# Patient Record
Sex: Female | Born: 1990 | Race: White | Hispanic: No | Smoking: Never smoker
Health system: Southern US, Community
[De-identification: ages and names within clinical notes are randomized; demographics above are authoritative.]

## PROBLEM LIST (undated history)

## (undated) ENCOUNTER — Inpatient Hospital Stay (HOSPITAL_COMMUNITY): Payer: Self-pay

## (undated) DIAGNOSIS — F32A Depression, unspecified: Secondary | ICD-10-CM

## (undated) DIAGNOSIS — F419 Anxiety disorder, unspecified: Secondary | ICD-10-CM

## (undated) DIAGNOSIS — F329 Major depressive disorder, single episode, unspecified: Secondary | ICD-10-CM

## (undated) DIAGNOSIS — T7840XA Allergy, unspecified, initial encounter: Secondary | ICD-10-CM

## (undated) DIAGNOSIS — K219 Gastro-esophageal reflux disease without esophagitis: Secondary | ICD-10-CM

## (undated) DIAGNOSIS — F509 Eating disorder, unspecified: Secondary | ICD-10-CM

## (undated) DIAGNOSIS — F319 Bipolar disorder, unspecified: Secondary | ICD-10-CM

## (undated) DIAGNOSIS — T50901A Poisoning by unspecified drugs, medicaments and biological substances, accidental (unintentional), initial encounter: Secondary | ICD-10-CM

## (undated) DIAGNOSIS — G43009 Migraine without aura, not intractable, without status migrainosus: Secondary | ICD-10-CM

## (undated) DIAGNOSIS — K829 Disease of gallbladder, unspecified: Secondary | ICD-10-CM

## (undated) DIAGNOSIS — Z8619 Personal history of other infectious and parasitic diseases: Secondary | ICD-10-CM

## (undated) HISTORY — DX: Migraine without aura, not intractable, without status migrainosus: G43.009

## (undated) HISTORY — DX: Gastro-esophageal reflux disease without esophagitis: K21.9

## (undated) HISTORY — DX: Bipolar disorder, unspecified: F31.9

## (undated) HISTORY — DX: Personal history of other infectious and parasitic diseases: Z86.19

## (undated) HISTORY — DX: Disease of gallbladder, unspecified: K82.9

## (undated) HISTORY — DX: Eating disorder, unspecified: F50.9

## (undated) HISTORY — DX: Allergy, unspecified, initial encounter: T78.40XA

## (undated) HISTORY — DX: Anxiety disorder, unspecified: F41.9

---
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
## 2011-01-19 ENCOUNTER — Emergency Department: Payer: Self-pay | Admitting: Emergency Medicine

## 2011-10-03 ENCOUNTER — Inpatient Hospital Stay (HOSPITAL_COMMUNITY)
Admission: EM | Admit: 2011-10-03 | Discharge: 2011-10-05 | DRG: 918 | Disposition: A | Discharge status: Home / Self Care | Payer: Self-pay | Attending: Internal Medicine | Admitting: Internal Medicine

## 2011-10-03 ENCOUNTER — Encounter (HOSPITAL_COMMUNITY): Payer: Self-pay | Admitting: *Deleted

## 2011-10-03 DIAGNOSIS — T43502A Poisoning by unspecified antipsychotics and neuroleptics, intentional self-harm, initial encounter: Secondary | ICD-10-CM | POA: Diagnosis present

## 2011-10-03 DIAGNOSIS — T50901A Poisoning by unspecified drugs, medicaments and biological substances, accidental (unintentional), initial encounter: Secondary | ICD-10-CM | POA: Diagnosis present

## 2011-10-03 DIAGNOSIS — F3289 Other specified depressive episodes: Secondary | ICD-10-CM | POA: Diagnosis present

## 2011-10-03 DIAGNOSIS — F32A Depression, unspecified: Secondary | ICD-10-CM

## 2011-10-03 DIAGNOSIS — T1491XA Suicide attempt, initial encounter: Secondary | ICD-10-CM | POA: Diagnosis present

## 2011-10-03 DIAGNOSIS — T43294A Poisoning by other antidepressants, undetermined, initial encounter: Principal | ICD-10-CM | POA: Diagnosis present

## 2011-10-03 DIAGNOSIS — F329 Major depressive disorder, single episode, unspecified: Secondary | ICD-10-CM | POA: Diagnosis present

## 2011-10-03 HISTORY — DX: Major depressive disorder, single episode, unspecified: F32.9

## 2011-10-03 HISTORY — DX: Depression, unspecified: F32.A

## 2011-10-03 HISTORY — DX: Poisoning by unspecified drugs, medicaments and biological substances, accidental (unintentional), initial encounter: T50.901A

## 2011-10-03 LAB — CBC
Hemoglobin: 13.3 g/dL (ref 12.0–15.0)
MCH: 29.9 pg (ref 26.0–34.0)
MCHC: 35.7 g/dL (ref 30.0–36.0)
MCV: 83.8 fL (ref 78.0–100.0)
RBC: 4.45 MIL/uL (ref 3.87–5.11)

## 2011-10-03 LAB — COMPREHENSIVE METABOLIC PANEL
ALT: 9 U/L (ref 0–35)
CO2: 23 mEq/L (ref 19–32)
Calcium: 9.2 mg/dL (ref 8.4–10.5)
Creatinine, Ser: 0.77 mg/dL (ref 0.50–1.10)
GFR calc Af Amer: >90 mL/min (ref >90)
GFR calc non Af Amer: >90 mL/min (ref >90)
Glucose, Bld: 140 mg/dL — ABNORMAL HIGH (ref 70–99)
Sodium: 137 mEq/L (ref 135–145)
Total Protein: 7.4 g/dL (ref 6.0–8.3)

## 2011-10-03 LAB — ACETAMINOPHEN LEVEL: Acetaminophen (Tylenol), Serum: <15 ug/mL (ref 10–30)

## 2011-10-03 LAB — RAPID URINE DRUG SCREEN, HOSP PERFORMED
Cocaine: NOT DETECTED
Opiates: NOT DETECTED

## 2011-10-03 LAB — POCT PREGNANCY, URINE: Preg Test, Ur: NEGATIVE

## 2011-10-03 LAB — ETHANOL: Alcohol, Ethyl (B): <11 mg/dL (ref 0–11)

## 2011-10-03 NOTE — ED Notes (Signed)
Pt c/o trazodone OD, unknown amount, taken 45 mins ago. Pt states she is depressed but does not want to die. Pt having dry heaves in triage, pale, oriented x 3

## 2011-10-03 NOTE — ED Notes (Signed)
Poison control called.  Trazadone causes CNS depression, hypotension, and respiratory depression as well as QT prolongation and bradycardia.  Celexa causes nausea and vomiting, mild sedation, possibly seizures, QTc prolongation, and QRS prolongation. For ingestion 600mg  or more of Celexa, monitor x 24 hours for possible delayed effect.  Charcoal is considered optional at this point.

## 2011-10-03 NOTE — ED Provider Notes (Signed)
History     CSN: [id number]  Arrival date & time 10/03/11  2221   First MD Initiated Contact with Patient 10/03/11 2303      Chief Complaint  Patient presents with  . Drug Overdose    trazodone  . Medical Clearance  . Suicidal    (Consider location/radiation/quality/duration/timing/severity/associated sxs/prior treatment) HPI Comments: Patient amount of trazodone and Celexa about one hour prior to arrival. She states she has a history of depression but had a fight with her mom it made her very sad and she wanted to kill herself. Last suicidal attempt was 3 years ago. Patient's only complaint currently is nausea.  Patient is a 21 y.o. female presenting with Overdose. The history is provided by the patient and a parent.  Drug Overdose This is a new problem. The current episode started 1 to 2 hours ago. The problem occurs constantly. The problem has not changed since onset.Pertinent negatives include no abdominal pain. Associated symptoms comments: nausea. The symptoms are aggravated by nothing. The symptoms are relieved by nothing. She has tried nothing for the symptoms. The treatment provided no relief.    Past Medical History  Diagnosis Date  . Overdose drug   . Depression     History reviewed. No pertinent past surgical history.  No family history on file.  History  Substance Use Topics  . Smoking status: Not on file  . Smokeless tobacco: Not on file  . Alcohol Use: Yes    OB History    Grav Para Term Preterm Abortions TAB SAB Ect Mult Living                  Review of Systems  Gastrointestinal: Positive for nausea. Negative for vomiting, abdominal pain and diarrhea.  Psychiatric/Behavioral: Positive for suicidal ideas.  All other systems reviewed and are negative.    Allergies  Review of patient's allergies indicates no known allergies.  Home Medications  No current outpatient prescriptions on file.  BP 106/92  Pulse 66  Temp(Src) 98.9 F (37.2 C)  (Oral)  Resp 19  SpO2 100%  LMP 10/03/2011  Physical Exam  Nursing note and vitals reviewed. Constitutional: She is oriented to person, place, and time. She appears well-developed and well-nourished. No distress.  HENT:  Head: Normocephalic and atraumatic.  Mouth/Throat: Oropharynx is clear and moist.  Eyes: EOM are normal. Pupils are equal, round, and reactive to light.  Cardiovascular: Normal rate, regular rhythm, normal heart sounds and intact distal pulses.  Exam reveals no friction rub.   No murmur heard. Pulmonary/Chest: Effort normal and breath sounds normal. She has no wheezes. She has no rales.  Abdominal: Soft. Bowel sounds are normal. She exhibits no distension. There is no tenderness. There is no rebound and no guarding.  Musculoskeletal: Normal range of motion. She exhibits no tenderness.       No edema  Neurological: She is alert and oriented to person, place, and time. No cranial nerve deficit.  Skin: Skin is warm and dry. No rash noted.  Psychiatric: Her behavior is normal. She exhibits a depressed mood. She expresses suicidal ideation. She expresses suicidal plans.    ED Course  Procedures (including critical care time)  Labs Reviewed  COMPREHENSIVE METABOLIC PANEL - Abnormal; Notable for the following:    Glucose, Bld 140 (*)    Total Bilirubin 1.4 (*)    All other components within normal limits  SALICYLATE LEVEL - Abnormal; Notable for the following:    Salicylate Lvl <2.0 (*)  All other components within normal limits  CBC  ETHANOL  ACETAMINOPHEN LEVEL  URINE RAPID DRUG SCREEN (HOSP PERFORMED)  TRICYCLICS SCREEN, URINE  POCT PREGNANCY, URINE  CBC  CREATININE, SERUM  BASIC METABOLIC PANEL  CBC   No results found.   Date: 10/04/2011  Rate: 72  Rhythm: normal sinus rhythm  QRS Axis: normal  Intervals: normal  ST/T Wave abnormalities: normal  Conduction Disutrbances:none  Narrative Interpretation:   Old EKG Reviewed: none available    1.  Drug overdose       MDM   Patient states she was trying to overdose today by taking an and then amount of trazodone and Celexa. Her only complaint currently is nausea. EKG did not show any prolonged QT at this time. Poison control was contacted and they suggested checking a CMP, alcohol, acetaminophen, salicylate, drug screen. They recommended hospitalization due to the unknown amount of Celexa ingested as it could cause QT prolongation.        Gwyneth Sprout, MD 10/04/11 [phone]

## 2011-10-03 NOTE — ED Notes (Signed)
Circa 2145 tonight, the pt took an unknown amount of trazadone 50mg  and an unknown amount of 20mg  celexa.  Pt states she is depressed and was trying to kill herself.  This is not the first time the pt has attempted suicide.

## 2011-10-04 ENCOUNTER — Other Ambulatory Visit: Payer: Self-pay

## 2011-10-04 ENCOUNTER — Encounter (HOSPITAL_COMMUNITY): Payer: Self-pay | Admitting: Family Medicine

## 2011-10-04 DIAGNOSIS — T50901A Poisoning by unspecified drugs, medicaments and biological substances, accidental (unintentional), initial encounter: Secondary | ICD-10-CM | POA: Diagnosis present

## 2011-10-04 DIAGNOSIS — T1491XA Suicide attempt, initial encounter: Secondary | ICD-10-CM | POA: Diagnosis present

## 2011-10-04 DIAGNOSIS — F32A Depression, unspecified: Secondary | ICD-10-CM

## 2011-10-04 DIAGNOSIS — F329 Major depressive disorder, single episode, unspecified: Secondary | ICD-10-CM

## 2011-10-04 LAB — TRICYCLICS SCREEN, URINE: TCA Scrn: NOT DETECTED

## 2011-10-04 MED ORDER — POTASSIUM CHLORIDE IN NACL 20-0.9 MEQ/L-% IV SOLN
INTRAVENOUS | Status: DC
  Administered 2011-10-04: 75 mL/h via INTRAVENOUS
  Administered 2011-10-04: 20:00:00 via INTRAVENOUS
  Filled 2011-10-04 (×5): qty 1000

## 2011-10-04 MED ORDER — SODIUM CHLORIDE 0.9 % IV BOLUS (SEPSIS)
1000.0000 mL | Freq: Once | INTRAVENOUS | Status: AC
  Administered 2011-10-04: 1000 mL via INTRAVENOUS

## 2011-10-04 MED ORDER — ENOXAPARIN SODIUM 40 MG/0.4ML ~~LOC~~ SOLN
40.0000 mg | SUBCUTANEOUS | Status: DC
  Filled 2011-10-04: qty 0.4

## 2011-10-04 NOTE — H&P (Signed)
PCP:   None Psychiatrist is Dr. Hope Pigeon   Chief Complaint:  Overdose  HPI: This is a 21 year old female with history of depression. She is prescribed trazodone 50 mg carefully when necessary insomnia and Celexa 20 mg daily. Per patient and mother, she has lots of stressors, including her job and money issues. She had an appointment with her counselor last week, which she missed and today she " snapped". At approximately 9:30 PM she took a handful of Celexa and trazodone, we are unable to get any quantification as to how much she took. This was eventually discovered and she was brought to the ER. In the ER the patient's EKG has no prolonged QTc interval. On discussion with poison control they recommend she should monitor on telemetry for 24 hours, with serial EKGs Hasson Celexa can cause late onset QTC prolongation. Patient had some nausea and vomiting earlier but no other symptomatology since. History provided by the patient and her multiple bedside.  Review of Systems: Positives bolded   The patient denies anorexia, fever, weight loss,, vision loss, decreased hearing, hoarseness, chest pain, syncope, dyspnea on exertion, peripheral edema, balance deficits, hemoptysis, abdominal pain, melena, hematochezia, severe indigestion/heartburn, hematuria, incontinence, genital sores, muscle weakness, suspicious skin lesions, transient blindness, difficulty walking, depression, unusual weight change, abnormal bleeding, enlarged lymph nodes, angioedema, and breast masses.  Past Medical History: Past Medical History  Diagnosis Date  . Overdose drug   . Depression    History reviewed. No pertinent past surgical history.  Medications: Prior to Admission medications   Medication Sig Start Date End Date Taking? Authorizing Provider  citalopram (CELEXA) 20 MG tablet Take 20 mg by mouth daily.   Yes Historical Provider, MD  traZODone (DESYREL) 50 MG tablet Take 50 mg by mouth at bedtime as  needed. For sleep   Yes Historical Provider, MD    Allergies:  No Known Allergies  Social History:  reports that she has never smoked. She does not have any smokeless tobacco history on file. She reports that she drinks alcohol. She reports that she does not use illicit drugs.  Family History: Family History  Problem Relation Age of Onset  . Coronary artery disease    . Hypothyroidism      Physical Exam: Filed Vitals:   10/03/11 2228 10/03/11 2234 10/04/11 0023 10/04/11 0107  BP: 96/62 106/92 88/51 95/55   Pulse: 66 66 79 80  Temp: 98 F (36.7 C) 98.9 F (37.2 C) 98.3 F (36.8 C) 98.3 F (36.8 C)  TempSrc: Oral Oral Oral Oral  Resp: 16 19 18 18   SpO2: 98% 100% 100% 99%    General:  Alert and oriented times three, well developed and nourished, no acute distress Eyes: PERRLA, pink conjunctiva, no scleral icterus ENT: Dry oral mucosa, neck supple, no thyromegaly Lungs: clear to ascultation, no wheeze, no crackles, no use of accessory muscles Cardiovascular: regular rate and rhythm, no regurgitation, no gallops, no murmurs. No carotid bruits, no JVD Abdomen: soft, positive BS, non-tender, non-distended, no organomegaly, not an acute abdomen GU: not examined Neuro: CN II - XII grossly intact, sensation intact Musculoskeletal: strength 5/5 all extremities, no clubbing, cyanosis or edema Skin: no rash, no subcutaneous crepitation, no decubitus Psych: appropriate patient   Labs on Admission:   [facility] 10/03/11 2259  NA 137  K 3.6  CL 102  CO2 23  GLUCOSE 140*  BUN 9  CREATININE 0.77  CALCIUM 9.2  MG --  PHOS --    Basename  10/03/11 2259  AST 11  ALT 9  ALKPHOS 74  BILITOT 1.4*  PROT 7.4  ALBUMIN 4.5   No results found for this basename: LIPASE:2,AMYLASE:2 in the last 72 hours  Basename 10/03/11 2259  WBC 5.9  NEUTROABS --  HGB 13.3  HCT 37.3  MCV 83.8  PLT 209   No results found for this basename: CKTOTAL:3,CKMB:3,CKMBINDEX:3,TROPONINI:3 in the  last 72 hours No components found with this basename: POCBNP:3 No results found for this basename: DDIMER:2 in the last 72 hours No results found for this basename: HGBA1C:2 in the last 72 hours No results found for this basename: CHOL:2,HDL:2,LDLCALC:2,TRIG:2,CHOLHDL:2,LDLDIRECT:2 in the last 72 hours No results found for this basename: TSH,T4TOTAL,FREET3,T3FREE,THYROIDAB in the last 72 hours No results found for this basename: VITAMINB12:2,FOLATE:2,FERRITIN:2,TIBC:2,IRON:2,RETICCTPCT:2 in the last 72 hours  Micro Results: No results found for this or any previous visit (from the past 240 hour(s)).   Radiological Exams on Admission: No results found.  Assessment/Plan Present on Admission:  .Overdose Suicide attempt Admit to telemetry measure with a one-to-one setting Monitor on telemetry with EKGs Q4 hours to evaluate for QTc prolongation. Poison control recommendation. Consult psychiatry in a.m. Home medications held  Full code DVT prophylaxis  team a/Dr. Thalia Bloodgood, Fadil Macmaster 10/04/2011, 2:07 AM

## 2011-10-04 NOTE — ED Notes (Signed)
Report given to K. Jeraldine Loots, RN

## 2011-10-04 NOTE — Progress Notes (Signed)
H&P/chart reviewed ,patient was admitted for suicidal attempt and drug overdose . Patient seen and examined ,sleepy but easily arousable ,denied any current complaints,admitted that she took celexa and trazodone to harm herself. EKGs reviewed showed normal findings so far x 3 EKGS ,as per H&P,poison control recommended EKG q4h ,continue to monitor on telemetry for 24 hours.continue 1:1 sitter.Psych service was consulted

## 2011-10-04 NOTE — Consult Note (Signed)
Reason for Consult: [name] is an 21 y.o. female.  HPI: Per H&P, This is a 21 year old female with history of depression. She is prescribed trazodone 50 mg carefully when necessary insomnia and Celexa 20 mg daily. Per patient and mother, she has lots of stressors, including her job and money issues. She had an appointment with her counselor last week, which she missed and today she " snapped". At approximately 9:30 PM she took a handful of Celexa and trazodone, we are unable to get any quantification as to how much she took. This was eventually discovered and she was brought to the ER. In the ER the patient's EKG has no prolonged QTc interval. On discussion with poison control they recommend she should monitor on telemetry for 24 hours, with serial EKGs Hasson Celexa can cause late onset QTC prolongation.   Per pt she has hx of depression since 04/2011. celexa was not helpful and used it for about 3 months. Follows with a psychiatrist currently. Sleep is very irregular due to her work schedule. Takes trazodone with some help. Had an argument with her mother before this OD and it made her upset and she took the OD (unknown quantity) impulsively in front her mother. She told her mother later that she should be brought to ED for help as she was not feeling well. One OD in 3 years ago and was admitted at [facility] and she did not like the hospital. Reports questionable hypomanic episodes at times but no manic symptoms currently. wants to go home and work on sleep hyegiene and work schedule. Plan to see her therapist and psychiatrist soon. Mother is also not concerned about this OD as SI and wants to take her home after medical clearance. No SI, NO hi or or AVH now.   Past Medical History  Diagnosis Date  . Overdose drug   . Depression     History reviewed. No pertinent past surgical history.  Family History  Problem Relation Age of Onset  . Coronary artery disease    . Hypothyroidism       Social History:  reports that she has never smoked. She does not have any smokeless tobacco history on file. She reports that she drinks about 1.2 ounces of alcohol per week. She reports that she does not use illicit drugs.  Allergies: No Known Allergies  Medications: I have reviewed the patient's current medications.  Results for orders placed during the hospital encounter of 10/03/11 (from the past 48 hour(s))  CBC     Status: Normal   Collection Time   10/03/11 10:59 PM      Component Value Range Comment   WBC 5.9  4.0 - 10.5 (K/uL)    RBC 4.45  3.87 - 5.11 (MIL/uL)    Hemoglobin 13.3  12.0 - 15.0 (g/dL)    HCT 16.1  09.6 - 04.5 (%)    MCV 83.8  78.0 - 100.0 (fL)    MCH 29.9  26.0 - 34.0 (pg)    MCHC 35.7  30.0 - 36.0 (g/dL)    RDW 40.9  81.1 - 91.4 (%)    Platelets 209  150 - 400 (K/uL)   COMPREHENSIVE METABOLIC PANEL     Status: Abnormal   Collection Time   10/03/11 10:59 PM      Component Value Range Comment   Sodium 137  135 - 145 (mEq/L)    Potassium 3.6  3.5 - 5.1 (mEq/L)    Chloride 102  96 - 112 (mEq/L)    CO2 23  19 - 32 (mEq/L)    Glucose, Bld 140 (*) 70 - 99 (mg/dL)    BUN 9  6 - 23 (mg/dL)    Creatinine, Ser 1.61  0.50 - 1.10 (mg/dL)    Calcium 9.2  8.4 - 10.5 (mg/dL)    Total Protein 7.4  6.0 - 8.3 (g/dL)    Albumin 4.5  3.5 - 5.2 (g/dL)    AST 11  0 - 37 (U/L)    ALT 9  0 - 35 (U/L)    Alkaline Phosphatase 74  39 - 117 (U/L)    Total Bilirubin 1.4 (*) 0.3 - 1.2 (mg/dL)    GFR calc non Af Amer >90  >90 (mL/min)    GFR calc Af Amer >90  >90 (mL/min)   ETHANOL     Status: Normal   Collection Time   10/03/11 10:59 PM      Component Value Range Comment   Alcohol, Ethyl (B) <11  0 - 11 (mg/dL)   ACETAMINOPHEN LEVEL     Status: Normal   Collection Time   10/03/11 10:59 PM      Component Value Range Comment   Acetaminophen (Tylenol), Serum <15.0  10 - 30 (ug/mL)   SALICYLATE LEVEL     Status: Abnormal   Collection Time   10/03/11 10:59 PM      Component  Value Range Comment   Salicylate Lvl <2.0 (*) 2.8 - 20.0 (mg/dL)   URINE RAPID DRUG SCREEN (HOSP PERFORMED)     Status: Normal   Collection Time   10/03/11 11:26 PM      Component Value Range Comment   Opiates NONE DETECTED  NONE DETECTED     Cocaine NONE DETECTED  NONE DETECTED     Benzodiazepines NONE DETECTED  NONE DETECTED     Amphetamines NONE DETECTED  NONE DETECTED     Tetrahydrocannabinol NONE DETECTED  NONE DETECTED     Barbiturates NONE DETECTED  NONE DETECTED    TRICYCLICS SCREEN, URINE     Status: Normal   Collection Time   10/03/11 11:26 PM      Component Value Range Comment   TCA Scrn NONE DETECTED  NONE DETECTED    POCT PREGNANCY, URINE     Status: Normal   Collection Time   10/03/11 11:31 PM      Component Value Range Comment   Preg Test, Ur NEGATIVE  NEGATIVE      No results found.  ROS Blood pressure 94/60, pulse 72, temperature 98.9 F (37.2 C), temperature source Oral, resp. rate 18, height 5\' 5"  (1.651 m), weight 57.924 kg (127 lb 11.2 oz), last menstrual period 10/03/2011, SpO2 98.00%. Physical Exam  Good eye contact, happy mood, affect broad, no si, no hi, no avh, logical, fair insight  Assessment/ 1.adjustement d/o with mixed emotions, mood d/o nod III. Def II. Sp od IV. Financial problems V; 45    Plan:   1. Offered inpatient psy but both pt and mother refused  2. NO SI thoughts and will recommend out pt psy f/u after discharge  3. Will sign off now.  Wonda Cerise 10/04/2011, 11:11 PM

## 2011-10-05 LAB — BASIC METABOLIC PANEL
CO2: 24 mEq/L (ref 19–32)
Chloride: 104 mEq/L (ref 96–112)
Creatinine, Ser: 0.65 mg/dL (ref 0.50–1.10)
GFR calc Af Amer: >90 mL/min (ref >90)
Potassium: 3.7 mEq/L (ref 3.5–5.1)
Sodium: 134 mEq/L — ABNORMAL LOW (ref 135–145)

## 2011-10-05 LAB — CBC
MCH: 29.6 pg (ref 26.0–34.0)
MCHC: 34.7 g/dL (ref 30.0–36.0)
Platelets: 197 10*3/uL (ref 150–400)

## 2011-10-05 NOTE — Discharge Summary (Signed)
Admit date: 10/03/2011 Discharge date: 10/05/2011  Primary Care Physician:  No primary provider on file.   Discharge Diagnoses:   Active Hospital Problems  Diagnoses Date Noted   . Depression 10/04/2011     Resolved Hospital Problems  Diagnoses Date Noted Date Resolved  . Overdose 10/04/2011 10/05/2011  . Suicide attempt 10/04/2011 10/05/2011     DISCHARGE MEDICATION: Medication List  As of 10/05/2011  9:22 AM   TAKE these medications         citalopram 20 MG tablet   Commonly known as: CELEXA   Take 20 mg by mouth daily.      traZODone 50 MG tablet   Commonly known as: DESYREL   Take 50 mg by mouth at bedtime as needed. For sleep              Consults:     SIGNIFICANT DIAGNOSTIC STUDIES:  No results found.   No results found for this or any previous visit (from the past 240 hour(s)).  BRIEF ADMITTING H & P: 21 year old female with history of depression. She is prescribed trazodone 50 mg carefully when necessary insomnia and Celexa 20 mg daily. Per patient and mother, she has lots of stressors, including her job and money issues. She had an appointment with her counselor last week, which she missed and today she " snapped". At approximately 9:30 PM she took a handful of Celexa and trazodone, we are unable to get any quantification as to how much she took. This was eventually discovered and she was brought to the ER. In the ER the patient's EKG has no prolonged QTc interval. On discussion with poison control they recommend she should monitor on telemetry for 24 hours, with serial EKGs Hasson Celexa can cause late onset QTC prolongation. Patient had some nausea and vomiting earlier but no other symptomatology since. History provided by the patient and her multiple bedside.   Active Hospital Problems  Diagnoses Date Noted   . Depression: Psychiatry was consulted and recommended to followup as an outpatient continue current medications.  10/04/2011     Resolved Hospital  Problems  Diagnoses Date Noted Date Resolved  . Overdose: She was admitted to the step down, poison control was consulted they recommended to follow her QTC which has been within normal limits. Psychiatry was consulted she refuses inpatient psych psych recommended to followup with her psychiatry as an outpatient. Her mother was at bedside and she agreed in the conversation that she can followup with her psychiatrist and outpatient.  10/04/2011 10/05/2011  . Suicide attempt: Psychiatry was consulted she was cleared, for followup with her psychiatry as an outpatient.  10/04/2011 10/05/2011     Disposition and Follow-up:  Discharge Orders    Future Orders Please Complete By Expires   Diet - low sodium heart healthy      Increase activity slowly           DISCHARGE EXAM:  General: Alert and oriented times three, well developed and nourished, no acute distress  Eyes: PERRLA, pink conjunctiva, no scleral icterus  ENT: moist mucose memebrane, neck supple, no thyromegaly  Lungs: clear to ascultation, no wheeze, no crackles, no use of accessory muscles  Cardiovascular: regular rate and rhythm, no regurgitation, no gallops, no murmurs. No carotid bruits, no JVD  Abdomen: soft, positive BS, non-tender, non-distended, no organomegaly, not an acute abdomen  GU: not examined  Neuro: CN II - XII grossly intact, sensation intact  Musculoskeletal: strength 5/5 all extremities, no clubbing, cyanosis  or edema  Skin: no rash, no subcutaneous crepitation, no decubitus  Psych: appropriate patient   Blood pressure 100/60, pulse 62, temperature 98.2 F (36.8 C), temperature source Oral, resp. rate 18, height 5\' 5"  (1.651 m), weight 57.924 kg (127 lb 11.2 oz), last menstrual period 10/03/2011, SpO2 100.00%.   Basename 10/05/11 0450 10/03/11 2259  NA 134* 137  K 3.7 3.6  CL 104 102  CO2 24 23  GLUCOSE 90 140*  BUN 4* 9  CREATININE 0.65 0.77  CALCIUM 8.8 9.2  MG -- --  PHOS -- --    Basename  10/03/11 2259  AST 11  ALT 9  ALKPHOS 74  BILITOT 1.4*  PROT 7.4  ALBUMIN 4.5   No results found for this basename: LIPASE:2,AMYLASE:2 in the last 72 hours  Basename 10/05/11 0450 10/03/11 2259  WBC 6.5 5.9  NEUTROABS -- --  HGB 11.7* 13.3  HCT 33.7* 37.3  MCV 85.3 83.8  PLT 197 209    Signed: Marinda Elk M.D. 10/05/2011, 9:22 AM

## 2012-09-17 ENCOUNTER — Emergency Department: Payer: Self-pay | Admitting: Emergency Medicine

## 2012-09-17 LAB — URINALYSIS, COMPLETE
Bilirubin,UR: NEGATIVE
Ketone: NEGATIVE
Leukocyte Esterase: NEGATIVE
Ph: 5 (ref 4.5–8.0)
Protein: 30
RBC,UR: 5 /HPF (ref 0–5)

## 2012-09-17 LAB — COMPREHENSIVE METABOLIC PANEL
Anion Gap: 6 — ABNORMAL LOW (ref 7–16)
BUN: 13 mg/dL (ref 7–18)
Chloride: 104 mmol/L (ref 98–107)
Creatinine: 0.76 mg/dL (ref 0.60–1.30)
EGFR (African American): >60
EGFR (Non-African Amer.): >60
Glucose: 98 mg/dL (ref 65–99)
Osmolality: 276 (ref 275–301)
SGOT(AST): 17 U/L (ref 15–37)
Total Protein: 7.5 g/dL (ref 6.4–8.2)

## 2012-09-17 LAB — CBC
HCT: 39 % (ref 35.0–47.0)
HGB: 13.5 g/dL (ref 12.0–16.0)
MCH: 29.6 pg (ref 26.0–34.0)
MCHC: 34.7 g/dL (ref 32.0–36.0)
Platelet: 231 10*3/uL (ref 150–440)
RBC: 4.57 10*6/uL (ref 3.80–5.20)
RDW: 12.5 % (ref 11.5–14.5)
WBC: 3.9 10*3/uL (ref 3.6–11.0)

## 2012-09-17 LAB — BILIRUBIN, DIRECT: Bilirubin, Direct: 0.2 mg/dL (ref 0.00–0.20)

## 2012-09-17 LAB — LIPASE, BLOOD: Lipase: 68 U/L — ABNORMAL LOW (ref 73–393)

## 2015-12-23 DIAGNOSIS — G44219 Episodic tension-type headache, not intractable: Secondary | ICD-10-CM | POA: Diagnosis not present

## 2016-03-01 DIAGNOSIS — J069 Acute upper respiratory infection, unspecified: Secondary | ICD-10-CM | POA: Diagnosis not present

## 2016-04-08 ENCOUNTER — Other Ambulatory Visit: Payer: Self-pay | Admitting: *Deleted

## 2016-04-08 ENCOUNTER — Ambulatory Visit: Payer: Self-pay | Admitting: Nurse Practitioner

## 2016-04-08 ENCOUNTER — Other Ambulatory Visit (INDEPENDENT_AMBULATORY_CARE_PROVIDER_SITE_OTHER): Payer: BLUE CROSS/BLUE SHIELD

## 2016-04-08 ENCOUNTER — Ambulatory Visit (INDEPENDENT_AMBULATORY_CARE_PROVIDER_SITE_OTHER): Payer: BLUE CROSS/BLUE SHIELD | Admitting: Nurse Practitioner

## 2016-04-08 ENCOUNTER — Other Ambulatory Visit (HOSPITAL_COMMUNITY)
Admission: RE | Admit: 2016-04-08 | Discharge: 2016-04-08 | Disposition: A | Discharge status: Home / Self Care | Payer: BLUE CROSS/BLUE SHIELD | Source: Ambulatory Visit | Attending: Nurse Practitioner | Admitting: Nurse Practitioner

## 2016-04-08 ENCOUNTER — Encounter: Payer: Self-pay | Admitting: Nurse Practitioner

## 2016-04-08 ENCOUNTER — Other Ambulatory Visit: Payer: BLUE CROSS/BLUE SHIELD

## 2016-04-08 VITALS — BP 102/69 | Temp 98.1°F | Ht 64.0 in | Wt 149.0 lb

## 2016-04-08 DIAGNOSIS — K219 Gastro-esophageal reflux disease without esophagitis: Secondary | ICD-10-CM | POA: Diagnosis not present

## 2016-04-08 DIAGNOSIS — Z113 Encounter for screening for infections with a predominantly sexual mode of transmission: Secondary | ICD-10-CM | POA: Insufficient documentation

## 2016-04-08 DIAGNOSIS — Z1151 Encounter for screening for human papillomavirus (HPV): Secondary | ICD-10-CM | POA: Insufficient documentation

## 2016-04-08 DIAGNOSIS — G4700 Insomnia, unspecified: Secondary | ICD-10-CM | POA: Insufficient documentation

## 2016-04-08 DIAGNOSIS — F319 Bipolar disorder, unspecified: Secondary | ICD-10-CM | POA: Insufficient documentation

## 2016-04-08 DIAGNOSIS — R6889 Other general symptoms and signs: Secondary | ICD-10-CM | POA: Diagnosis not present

## 2016-04-08 DIAGNOSIS — Z0001 Encounter for general adult medical examination with abnormal findings: Secondary | ICD-10-CM

## 2016-04-08 DIAGNOSIS — Z23 Encounter for immunization: Secondary | ICD-10-CM

## 2016-04-08 DIAGNOSIS — R1013 Epigastric pain: Secondary | ICD-10-CM | POA: Diagnosis not present

## 2016-04-08 DIAGNOSIS — Z62819 Personal history of unspecified abuse in childhood: Secondary | ICD-10-CM | POA: Diagnosis not present

## 2016-04-08 DIAGNOSIS — Z01411 Encounter for gynecological examination (general) (routine) with abnormal findings: Secondary | ICD-10-CM | POA: Diagnosis not present

## 2016-04-08 DIAGNOSIS — F329 Major depressive disorder, single episode, unspecified: Secondary | ICD-10-CM | POA: Diagnosis not present

## 2016-04-08 DIAGNOSIS — F32A Depression, unspecified: Secondary | ICD-10-CM

## 2016-04-08 DIAGNOSIS — R87610 Atypical squamous cells of undetermined significance on cytologic smear of cervix (ASC-US): Secondary | ICD-10-CM | POA: Diagnosis not present

## 2016-04-08 LAB — CBC WITH DIFFERENTIAL/PLATELET
BASOS PCT: 0.5 % (ref 0.0–3.0)
Basophils Absolute: 0 10*3/uL (ref 0.0–0.1)
EOS PCT: 1.2 % (ref 0.0–5.0)
Eosinophils Absolute: 0.1 10*3/uL (ref 0.0–0.7)
HCT: 37.8 % (ref 36.0–46.0)
Hemoglobin: 13.4 g/dL (ref 12.0–15.0)
LYMPHS ABS: 3.1 10*3/uL (ref 0.7–4.0)
Lymphocytes Relative: 40.5 % (ref 12.0–46.0)
MCHC: 35.3 g/dL (ref 30.0–36.0)
MCV: 84.5 fl (ref 78.0–100.0)
MONO ABS: 0.4 10*3/uL (ref 0.1–1.0)
MONOS PCT: 5.1 % (ref 3.0–12.0)
NEUTROS ABS: 4 10*3/uL (ref 1.4–7.7)
NEUTROS PCT: 52.7 % (ref 43.0–77.0)
Platelets: 304 10*3/uL (ref 150.0–400.0)
RBC: 4.47 Mil/uL (ref 3.87–5.11)
RDW: 12.8 % (ref 11.5–15.5)
WBC: 7.6 10*3/uL (ref 4.0–10.5)

## 2016-04-08 LAB — COMPREHENSIVE METABOLIC PANEL
ALBUMIN: 4.2 g/dL (ref 3.5–5.2)
ALK PHOS: 50 U/L (ref 39–117)
ALT: 9 U/L (ref 0–35)
AST: 10 U/L (ref 0–37)
BILIRUBIN TOTAL: 1.9 mg/dL — AB (ref 0.2–1.2)
BUN: 13 mg/dL (ref 6–23)
CO2: 28 mEq/L (ref 19–32)
Calcium: 9.1 mg/dL (ref 8.4–10.5)
Chloride: 104 mEq/L (ref 96–112)
Creatinine, Ser: 0.81 mg/dL (ref 0.40–1.20)
GFR: 92.44 mL/min (ref >60.00)
GLUCOSE: 114 mg/dL — AB (ref 70–99)
Potassium: 4.6 mEq/L (ref 3.5–5.1)
SODIUM: 137 meq/L (ref 135–145)
TOTAL PROTEIN: 7.4 g/dL (ref 6.0–8.3)

## 2016-04-08 LAB — WET PREP, GENITAL
Trich, Wet Prep: NONE SEEN — AB
Yeast Wet Prep HPF POC: NONE SEEN — AB

## 2016-04-08 LAB — TSH: TSH: 0.5 u[IU]/mL (ref 0.35–4.50)

## 2016-04-08 MED ORDER — OMEPRAZOLE 20 MG PO CPDR: 20.0000 mg | DELAYED_RELEASE_CAPSULE | Freq: Every day | ORAL | 3 refills | Status: DC
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
## 2016-04-08 NOTE — Patient Instructions (Signed)
Food Choices for Gastroesophageal Reflux Disease, Adult When you have gastroesophageal reflux disease (GERD), the foods you eat and your eating habits are very important. Choosing the right foods can help ease the discomfort of GERD. WHAT GENERAL GUIDELINES DO I NEED TO FOLLOW?  Choose fruits, vegetables, whole grains, low-fat dairy products, and low-fat meat, fish, and poultry.  Limit fats such as oils, salad dressings, butter, nuts, and avocado.  Keep a food diary to identify foods that cause symptoms.  Avoid foods that cause reflux. These may be different for different people.  Eat frequent small meals instead of three large meals each day.  Eat your meals slowly, in a relaxed setting.  Limit fried foods.  Cook foods using methods other than frying.  Avoid drinking alcohol.  Avoid drinking large amounts of liquids with your meals.  Avoid bending over or lying down until 2-3 hours after eating. WHAT FOODS ARE NOT RECOMMENDED? The following are some foods and drinks that may worsen your symptoms: Vegetables Tomatoes. Tomato juice. Tomato and spaghetti sauce. Chili peppers. Onion and garlic. Horseradish. Fruits Oranges, grapefruit, and lemon (fruit and juice). Meats High-fat meats, fish, and poultry. This includes hot dogs, ribs, ham, sausage, salami, and bacon. Dairy Whole milk and chocolate milk. Sour cream. Cream. Butter. Ice cream. Cream cheese.  Beverages Coffee and tea, with or without caffeine. Carbonated beverages or energy drinks. Condiments Hot sauce. Barbecue sauce.  Sweets/Desserts Chocolate and cocoa. Donuts. Peppermint and spearmint. Fats and Oils High-fat foods, including French fries and potato chips. Other Vinegar. Strong spices, such as black pepper, white pepper, red pepper, cayenne, curry powder, cloves, ginger, and chili powder. The items listed above may not be a complete list of foods and beverages to avoid. Contact your dietitian for more  information.   This information is not intended to replace advice given to you by your health care provider. Make sure you discuss any questions you have with your health care provider.   Document Released: 07/06/2005 Document Revised: 07/27/2014 Document Reviewed: 05/10/2013 Elsevier Interactive Patient Education 2016 [employer] Health Maintenance, Female Adopting a healthy lifestyle and getting preventive care can go a long way to promote health and wellness. Talk with your health care provider about what schedule of regular examinations is right for you. This is a good chance for you to check in with your provider about disease prevention and staying healthy. In between checkups, there are plenty of things you can do on your own. Experts have done a lot of research about which lifestyle changes and preventive measures are most likely to keep you healthy. Ask your health care provider for more information. WEIGHT AND DIET  Eat a healthy diet  Be sure to include plenty of vegetables, fruits, low-fat dairy products, and lean protein.  Do not eat a lot of foods high in solid fats, added sugars, or salt.  Get regular exercise. This is one of the most important things you can do for your health.  Most adults should exercise for at least 150 minutes each week. The exercise should increase your heart rate and make you sweat (moderate-intensity exercise).  Most adults should also do strengthening exercises at least twice a week. This is in addition to the moderate-intensity exercise.  Maintain a healthy weight  Body mass index (BMI) is a measurement that can be used to identify possible weight problems. It estimates body fat based on height and weight. Your health care provider can help determine your BMI and help you achieve   or maintain a healthy weight.  For females 20 years of age and older:   A BMI below 18.5 is considered underweight.  A BMI of 18.5 to 24.9 is normal.  A BMI of 25  to 29.9 is considered overweight.  A BMI of 30 and above is considered obese.  Watch levels of cholesterol and blood lipids  You should start having your blood tested for lipids and cholesterol at 24 years of age, then have this test every 5 years.  You may need to have your cholesterol levels checked more often if:  Your lipid or cholesterol levels are high.  You are older than 24 years of age.  You are at high risk for heart disease.  CANCER SCREENING   Lung Cancer  Lung cancer screening is recommended for adults 55-80 years old who are at high risk for lung cancer because of a history of smoking.  A yearly low-dose CT scan of the lungs is recommended for people who:  Currently smoke.  Have quit within the past 15 years.  Have at least a 30-pack-year history of smoking. A pack year is smoking an average of one pack of cigarettes a day for 1 year.  Yearly screening should continue until it has been 15 years since you quit.  Yearly screening should stop if you develop a health problem that would prevent you from having lung cancer treatment.  Breast Cancer  Practice breast self-awareness. This means understanding how your breasts normally appear and feel.  It also means doing regular breast self-exams. Let your health care provider know about any changes, no matter how small.  If you are in your 20s or 30s, you should have a clinical breast exam (CBE) by a health care provider every 1-3 years as part of a regular health exam.  If you are 40 or older, have a CBE every year. Also consider having a breast X-ray (mammogram) every year.  If you have a family history of breast cancer, talk to your health care provider about genetic screening.  If you are at high risk for breast cancer, talk to your health care provider about having an MRI and a mammogram every year.  Breast cancer gene (BRCA) assessment is recommended for women who have family members with BRCA-related  cancers. BRCA-related cancers include:  Breast.  Ovarian.  Tubal.  Peritoneal cancers.  Results of the assessment will determine the need for genetic counseling and BRCA1 and BRCA2 testing. Cervical Cancer Your health care provider may recommend that you be screened regularly for cancer of the pelvic organs (ovaries, uterus, and vagina). This screening involves a pelvic examination, including checking for microscopic changes to the surface of your cervix (Pap test). You may be encouraged to have this screening done every 3 years, beginning at age 21.  For women ages 30-65, health care providers may recommend pelvic exams and Pap testing every 3 years, or they may recommend the Pap and pelvic exam, combined with testing for human papilloma virus (HPV), every 5 years. Some types of HPV increase your risk of cervical cancer. Testing for HPV may also be done on women of any age with unclear Pap test results.  Other health care providers may not recommend any screening for nonpregnant women who are considered low risk for pelvic cancer and who do not have symptoms. Ask your health care provider if a screening pelvic exam is right for you.  If you have had past treatment for cervical cancer or a condition   that could lead to cancer, you need Pap tests and screening for cancer for at least 20 years after your treatment. If Pap tests have been discontinued, your risk factors (such as having a new sexual partner) need to be reassessed to determine if screening should resume. Some women have medical problems that increase the chance of getting cervical cancer. In these cases, your health care provider may recommend more frequent screening and Pap tests. Colorectal Cancer  This type of cancer can be detected and often prevented.  Routine colorectal cancer screening usually begins at 24 years of age and continues through 24 years of age.  Your health care provider may recommend screening at an earlier  age if you have risk factors for colon cancer.  Your health care provider may also recommend using home test kits to check for hidden blood in the stool.  A small camera at the end of a tube can be used to examine your colon directly (sigmoidoscopy or colonoscopy). This is done to check for the earliest forms of colorectal cancer.  Routine screening usually begins at age 50.  Direct examination of the colon should be repeated every 5-10 years through 24 years of age. However, you may need to be screened more often if early forms of precancerous polyps or small growths are found. Skin Cancer  Check your skin from head to toe regularly.  Tell your health care provider about any new moles or changes in moles, especially if there is a change in a mole's shape or color.  Also tell your health care provider if you have a mole that is larger than the size of a pencil eraser.  Always use sunscreen. Apply sunscreen liberally and repeatedly throughout the day.  Protect yourself by wearing long sleeves, pants, a wide-brimmed hat, and sunglasses whenever you are outside. HEART DISEASE, DIABETES, AND HIGH BLOOD PRESSURE   High blood pressure causes heart disease and increases the risk of stroke. High blood pressure is more likely to develop in:  People who have blood pressure in the high end of the normal range (130-139/85-89 mm Hg).  People who are overweight or obese.  People who are African American.  If you are 18-39 years of age, have your blood pressure checked every 3-5 years. If you are 40 years of age or older, have your blood pressure checked every year. You should have your blood pressure measured twice--once when you are at a hospital or clinic, and once when you are not at a hospital or clinic. Record the average of the two measurements. To check your blood pressure when you are not at a hospital or clinic, you can use:  An automated blood pressure machine at a pharmacy.  A home  blood pressure monitor.  If you are between 55 years and 79 years old, ask your health care provider if you should take aspirin to prevent strokes.  Have regular diabetes screenings. This involves taking a blood sample to check your fasting blood sugar level.  If you are at a normal weight and have a low risk for diabetes, have this test once every three years after 24 years of age.  If you are overweight and have a high risk for diabetes, consider being tested at a younger age or more often. PREVENTING INFECTION  Hepatitis B  If you have a higher risk for hepatitis B, you should be screened for this virus. You are considered at high risk for hepatitis B if:  You were born   in a country where hepatitis B is common. Ask your health care provider which countries are considered high risk.  Your parents were born in a high-risk country, and you have not been immunized against hepatitis B (hepatitis B vaccine).  You have HIV or AIDS.  You use needles to inject street drugs.  You live with someone who has hepatitis B.  You have had sex with someone who has hepatitis B.  You get hemodialysis treatment.  You take certain medicines for conditions, including cancer, organ transplantation, and autoimmune conditions. Hepatitis C  Blood testing is recommended for:  Everyone born from 1945 through 1965.  Anyone with known risk factors for hepatitis C. Sexually transmitted infections (STIs)  You should be screened for sexually transmitted infections (STIs) including gonorrhea and chlamydia if:  You are sexually active and are younger than 24 years of age.  You are older than 24 years of age and your health care provider tells you that you are at risk for this type of infection.  Your sexual activity has changed since you were last screened and you are at an increased risk for chlamydia or gonorrhea. Ask your health care provider if you are at risk.  If you do not have HIV, but are at  risk, it may be recommended that you take a prescription medicine daily to prevent HIV infection. This is called pre-exposure prophylaxis (PrEP). You are considered at risk if:  You are sexually active and do not regularly use condoms or know the HIV status of your partner(s).  You take drugs by injection.  You are sexually active with a partner who has HIV. Talk with your health care provider about whether you are at high risk of being infected with HIV. If you choose to begin PrEP, you should first be tested for HIV. You should then be tested every 3 months for as long as you are taking PrEP.  PREGNANCY   If you are premenopausal and you may become pregnant, ask your health care provider about preconception counseling.  If you may become pregnant, take 400 to 800 micrograms (mcg) of folic acid every day.  If you want to prevent pregnancy, talk to your health care provider about birth control (contraception). OSTEOPOROSIS AND MENOPAUSE   Osteoporosis is a disease in which the bones lose minerals and strength with aging. This can result in serious bone fractures. Your risk for osteoporosis can be identified using a bone density scan.  If you are 65 years of age or older, or if you are at risk for osteoporosis and fractures, ask your health care provider if you should be screened.  Ask your health care provider whether you should take a calcium or vitamin D supplement to lower your risk for osteoporosis.  Menopause may have certain physical symptoms and risks.  Hormone replacement therapy may reduce some of these symptoms and risks. Talk to your health care provider about whether hormone replacement therapy is right for you.  HOME CARE INSTRUCTIONS   Schedule regular health, dental, and eye exams.  Stay current with your immunizations.   Do not use any tobacco products including cigarettes, chewing tobacco, or electronic cigarettes.  If you are pregnant, do not drink alcohol.  If  you are breastfeeding, limit how much and how often you drink alcohol.  Limit alcohol intake to no more than 1 drink per day for nonpregnant women. One drink equals 12 ounces of beer, 5 ounces of wine, or 1 ounces of hard liquor.    Do not use street drugs.  Do not share needles.  Ask your health care provider for help if you need support or information about quitting drugs.  Tell your health care provider if you often feel depressed.  Tell your health care provider if you have ever been abused or do not feel safe at home.   This information is not intended to replace advice given to you by your health care provider. Make sure you discuss any questions you have with your health care provider.   Document Released: 01/19/2011 Document Revised: 07/27/2014 Document Reviewed: 06/07/2013 Elsevier Interactive Patient Education 2016 [employer] Inc[employer]  

## 2016-04-08 NOTE — Assessment & Plan Note (Signed)
Start omeprazole Pending H.pylori

## 2016-04-08 NOTE — Progress Notes (Signed)
Normal results: few WBC and clue cells In absence of symptoms, no treatment is necessary at this time.

## 2016-04-09 LAB — H. PYLORI ANTIBODY, IGG: H Pylori IgG: NEGATIVE

## 2016-04-10 LAB — CYTOLOGY - PAP

## 2016-04-30 DIAGNOSIS — F3131 Bipolar disorder, current episode depressed, mild: Secondary | ICD-10-CM | POA: Diagnosis not present

## 2016-05-07 ENCOUNTER — Encounter: Payer: Self-pay | Admitting: Adult Health

## 2016-05-07 ENCOUNTER — Ambulatory Visit (INDEPENDENT_AMBULATORY_CARE_PROVIDER_SITE_OTHER): Payer: BLUE CROSS/BLUE SHIELD | Admitting: Adult Health

## 2016-05-07 ENCOUNTER — Ambulatory Visit (INDEPENDENT_AMBULATORY_CARE_PROVIDER_SITE_OTHER)
Admission: RE | Admit: 2016-05-07 | Discharge: 2016-05-07 | Disposition: A | Discharge status: Home / Self Care | Payer: BLUE CROSS/BLUE SHIELD | Source: Ambulatory Visit | Attending: Adult Health | Admitting: Adult Health

## 2016-05-07 VITALS — BP 100/72 | Temp 98.5°F | Ht 64.0 in | Wt 146.2 lb

## 2016-05-07 DIAGNOSIS — R05 Cough: Secondary | ICD-10-CM | POA: Diagnosis not present

## 2016-05-07 DIAGNOSIS — J189 Pneumonia, unspecified organism: Secondary | ICD-10-CM

## 2016-05-07 DIAGNOSIS — J181 Lobar pneumonia, unspecified organism: Secondary | ICD-10-CM | POA: Diagnosis not present

## 2016-05-07 MED ORDER — DOXYCYCLINE HYCLATE 100 MG PO CAPS: 100.0000 mg | ORAL_CAPSULE | Freq: Two times a day (BID) | ORAL | 0 refills | Status: DC

## 2016-05-07 MED ORDER — ONDANSETRON HCL 4 MG PO TABS: 4.0000 mg | ORAL_TABLET | Freq: Three times a day (TID) | ORAL | 0 refills | Status: DC | PRN

## 2016-05-07 MED ORDER — PREDNISONE 10 MG PO TABS: ORAL_TABLET | ORAL | 0 refills | Status: DC

## 2016-05-07 NOTE — Progress Notes (Signed)
   Subjective:    Patient ID: Kim Lloyd, female    DOB: 20-Jun-1991, 25 y.o.   MRN: [id number]  HPI  25 year old female who presents to the office today with the complaints of six days of fevers ( up to 101),fever is worse during the evening, semi productive cough,SOB,  vomiting, nausea, vomiting, diarrhea, and generalized body aches. She endorses back pain in left lower back  She has been using tylenol to help control her fevers and body aches.   She has been vaccinated for the flu.   She works in [employer] and is around sick contacts often   Review of Systems  Constitutional: Positive for activity change, appetite change, chills, diaphoresis, fatigue and fever.  Respiratory: Positive for cough and shortness of breath.   Cardiovascular: Negative.   Gastrointestinal: Positive for diarrhea, nausea and vomiting.  Musculoskeletal: Positive for myalgias.  Neurological: Positive for headaches.  Hematological: Negative.   Psychiatric/Behavioral: Negative for sleep disturbance.  All other systems reviewed and are negative.      Objective:   Physical Exam  Constitutional: She appears well-developed and well-nourished. No distress.  HENT:  Head: Normocephalic and atraumatic.  Right Ear: External ear normal.  Left Ear: External ear normal.  Nose: Nose normal.  Mouth/Throat: Oropharynx is clear and moist. No oropharyngeal exudate.  Eyes: Conjunctivae and EOM are normal. Pupils are equal, round, and reactive to light. Right eye exhibits no discharge. No scleral icterus.  Neck: Normal range of motion. Neck supple. No thyromegaly present.  Cardiovascular: Normal rate, regular rhythm, normal heart sounds and intact distal pulses.  Exam reveals no gallop and no friction rub.   No murmur heard. Pulmonary/Chest: Effort normal. She has no wheezes. She has rhonchi in the left middle field and the left lower field. She has no rales. She exhibits no tenderness.  Abdominal: Soft. Bowel  sounds are normal. She exhibits no distension and no mass. There is hepatomegaly. There is no hepatosplenomegaly. There is tenderness in the left lower quadrant. There is no rebound, no guarding and no CVA tenderness.  Lymphadenopathy:    She has no cervical adenopathy.  Neurological: She is alert.  Skin: Skin is warm and dry. No rash noted. She is not diaphoretic. No erythema. No pallor.  Psychiatric: She has a normal mood and affect. Her behavior is normal. Judgment normal.  Nursing note and vitals reviewed.     Assessment & Plan:  1. Community acquired pneumonia of left lower lobe of lung (HCC) - Concern for pneumonia  - DG Chest 2 View; Future - predniSONE (DELTASONE) 10 MG tablet; 40 mg x 3 days, 20 ,mg x 3 days , 10 mg x 3 days  Dispense: 21 tablet; Refill: 0 - doxycycline (VIBRAMYCIN) 100 MG capsule; Take 1 capsule (100 mg total) by mouth 2 (two) times daily.  Dispense: 20 capsule; Refill: 0 - ondansetron (ZOFRAN) 4 MG tablet; Take 1 tablet (4 mg total) by mouth every 8 (eight) hours as needed for nausea or vomiting.  Dispense: 20 tablet; Refill: 0 - CBC with Differential/Platelet  Shirline Freesory Edu On, NP

## 2016-05-07 NOTE — Patient Instructions (Signed)
It was great meeting you today.   I am sorry you are feeling badly.   Please go to the [facility] office for a chest x ray   I have sent in a prescription for Doxycycline, Prednisone and Zofran. Take as directed.   I will follow up with you when I get the results back

## 2016-05-08 LAB — CBC WITH DIFFERENTIAL/PLATELET
BASOS ABS: 0 10*3/uL (ref 0.0–0.1)
Basophils Relative: 0.3 % (ref 0.0–3.0)
EOS ABS: 0 10*3/uL (ref 0.0–0.7)
Eosinophils Relative: 0.5 % (ref 0.0–5.0)
HCT: 36.3 % (ref 36.0–46.0)
Hemoglobin: 12.6 g/dL (ref 12.0–15.0)
LYMPHS ABS: 1.3 10*3/uL (ref 0.7–4.0)
Lymphocytes Relative: 22 % (ref 12.0–46.0)
MCHC: 34.6 g/dL (ref 30.0–36.0)
MCV: 85 fl (ref 78.0–100.0)
MONO ABS: 0.4 10*3/uL (ref 0.1–1.0)
Monocytes Relative: 7.4 % (ref 3.0–12.0)
NEUTROS ABS: 4.2 10*3/uL (ref 1.4–7.7)
NEUTROS PCT: 69.8 % (ref 43.0–77.0)
PLATELETS: 250 10*3/uL (ref 150.0–400.0)
RBC: 4.27 Mil/uL (ref 3.87–5.11)
RDW: 12.3 % (ref 11.5–15.5)
WBC: 6 10*3/uL (ref 4.0–10.5)

## 2016-05-13 DIAGNOSIS — F3131 Bipolar disorder, current episode depressed, mild: Secondary | ICD-10-CM | POA: Diagnosis not present

## 2016-05-22 DIAGNOSIS — F3131 Bipolar disorder, current episode depressed, mild: Secondary | ICD-10-CM | POA: Diagnosis not present

## 2016-06-03 DIAGNOSIS — F3131 Bipolar disorder, current episode depressed, mild: Secondary | ICD-10-CM | POA: Diagnosis not present

## 2016-06-08 DIAGNOSIS — F39 Unspecified mood [affective] disorder: Secondary | ICD-10-CM | POA: Diagnosis not present

## 2016-06-08 DIAGNOSIS — F419 Anxiety disorder, unspecified: Secondary | ICD-10-CM | POA: Diagnosis not present

## 2016-06-08 DIAGNOSIS — F3131 Bipolar disorder, current episode depressed, mild: Secondary | ICD-10-CM | POA: Diagnosis not present

## 2016-06-18 DIAGNOSIS — F3131 Bipolar disorder, current episode depressed, mild: Secondary | ICD-10-CM | POA: Diagnosis not present

## 2016-06-24 ENCOUNTER — Encounter: Payer: Self-pay | Admitting: Adult Health

## 2016-06-24 DIAGNOSIS — F3131 Bipolar disorder, current episode depressed, mild: Secondary | ICD-10-CM | POA: Diagnosis not present

## 2016-06-29 ENCOUNTER — Telehealth: Payer: Self-pay | Admitting: Nurse Practitioner

## 2016-06-29 NOTE — Telephone Encounter (Signed)
[facility] [address] for TRI-SPRINTEC TABLET quantity 28 take 1 tab PO daily. Last ov with you was 03/2016. Okey to refill this med?

## 2016-06-30 NOTE — Telephone Encounter (Signed)
she needs an OV appt.

## 2016-06-30 NOTE — Telephone Encounter (Signed)
Pt left msg on triage stating charlotte said she would refill her BC pills & Omeprazole. She is needing BC to be filled today  Took last one today. Pls advise...Raechel Chute/lmb

## 2016-07-01 ENCOUNTER — Other Ambulatory Visit: Payer: Self-pay | Admitting: Nurse Practitioner

## 2016-07-01 ENCOUNTER — Ambulatory Visit (INDEPENDENT_AMBULATORY_CARE_PROVIDER_SITE_OTHER): Payer: BLUE CROSS/BLUE SHIELD | Admitting: Geriatric Medicine

## 2016-07-01 ENCOUNTER — Other Ambulatory Visit: Payer: Self-pay | Admitting: Geriatric Medicine

## 2016-07-01 DIAGNOSIS — Z3040 Encounter for surveillance of contraceptives, unspecified: Secondary | ICD-10-CM

## 2016-07-01 DIAGNOSIS — Z3041 Encounter for surveillance of contraceptive pills: Secondary | ICD-10-CM

## 2016-07-01 LAB — POCT URINE PREGNANCY: PREG TEST UR: NEGATIVE

## 2016-07-01 MED ORDER — TRI-SPRINTEC 0.18/0.215/0.25 MG-35 MCG PO TABS: 1.0000 | ORAL_TABLET | Freq: Every day | ORAL | 12 refills | Status: DC

## 2016-07-01 NOTE — Telephone Encounter (Signed)
Called pt no answer LMOM w/[place] response.../lmb 

## 2016-07-01 NOTE — Telephone Encounter (Signed)
Pt call back stating she is completely out of meds, and needing her BC pills today. She said she was told to f/u in March, and she made an appt for March before she left. Requesting a refill/or sample until she is able to get in for another appt...Raechel Chute/lmb

## 2016-07-01 NOTE — Telephone Encounter (Signed)
Starting birth control.  Yes she had a physical but birth control counseling was not part of our visit.

## 2016-07-01 NOTE — Telephone Encounter (Signed)
Pt states she was just her in Sept for her Physical wanting to know reason why she need another appt?...Kim Lloyd/lmb

## 2016-07-01 NOTE — Telephone Encounter (Signed)
Called pt back to inform her of the option from [place] of coming to have the urine pregnancy done prior sending a refill to pharmacy until she is able to get in. Pt will come in next hour or so. She states she has missed 3 days already and she just don't want to mess her cycle up. She has made appt for the tomorrow. Lab doesn't do pregnancy test so I have put her on the nurse visit to have done today...Raechel Chute/lmb

## 2016-07-01 NOTE — Telephone Encounter (Signed)
Ok, thank you

## 2016-07-02 ENCOUNTER — Ambulatory Visit: Payer: BLUE CROSS/BLUE SHIELD | Admitting: Nurse Practitioner

## 2016-07-03 DIAGNOSIS — F3131 Bipolar disorder, current episode depressed, mild: Secondary | ICD-10-CM | POA: Diagnosis not present

## 2016-07-08 DIAGNOSIS — F3131 Bipolar disorder, current episode depressed, mild: Secondary | ICD-10-CM | POA: Diagnosis not present

## 2016-07-10 DIAGNOSIS — F39 Unspecified mood [affective] disorder: Secondary | ICD-10-CM | POA: Diagnosis not present

## 2016-07-10 DIAGNOSIS — F419 Anxiety disorder, unspecified: Secondary | ICD-10-CM | POA: Diagnosis not present

## 2016-07-23 ENCOUNTER — Encounter: Payer: Self-pay | Admitting: Gastroenterology

## 2016-07-29 DIAGNOSIS — F3131 Bipolar disorder, current episode depressed, mild: Secondary | ICD-10-CM | POA: Diagnosis not present

## 2016-08-07 ENCOUNTER — Other Ambulatory Visit: Payer: Self-pay | Admitting: Nurse Practitioner

## 2016-08-07 DIAGNOSIS — K219 Gastro-esophageal reflux disease without esophagitis: Secondary | ICD-10-CM

## 2016-08-07 DIAGNOSIS — R1013 Epigastric pain: Secondary | ICD-10-CM

## 2016-08-10 DIAGNOSIS — F4310 Post-traumatic stress disorder, unspecified: Secondary | ICD-10-CM | POA: Diagnosis not present

## 2016-08-13 DIAGNOSIS — F3131 Bipolar disorder, current episode depressed, mild: Secondary | ICD-10-CM | POA: Diagnosis not present

## 2016-08-19 DIAGNOSIS — F419 Anxiety disorder, unspecified: Secondary | ICD-10-CM | POA: Diagnosis not present

## 2016-08-19 DIAGNOSIS — F39 Unspecified mood [affective] disorder: Secondary | ICD-10-CM | POA: Diagnosis not present

## 2016-08-25 DIAGNOSIS — F3131 Bipolar disorder, current episode depressed, mild: Secondary | ICD-10-CM | POA: Diagnosis not present

## 2016-08-26 ENCOUNTER — Telehealth: Payer: Self-pay | Admitting: Gastroenterology

## 2016-08-26 ENCOUNTER — Ambulatory Visit: Payer: BLUE CROSS/BLUE SHIELD | Admitting: Gastroenterology

## 2016-08-26 NOTE — Telephone Encounter (Signed)
Do you want charge?

## 2016-08-26 NOTE — Telephone Encounter (Signed)
No charge this time. 

## 2016-09-10 DIAGNOSIS — F3131 Bipolar disorder, current episode depressed, mild: Secondary | ICD-10-CM | POA: Diagnosis not present

## 2016-09-15 DIAGNOSIS — F39 Unspecified mood [affective] disorder: Secondary | ICD-10-CM | POA: Diagnosis not present

## 2016-09-15 DIAGNOSIS — F419 Anxiety disorder, unspecified: Secondary | ICD-10-CM | POA: Diagnosis not present

## 2016-09-16 DIAGNOSIS — F3131 Bipolar disorder, current episode depressed, mild: Secondary | ICD-10-CM | POA: Diagnosis not present

## 2016-10-01 ENCOUNTER — Ambulatory Visit: Payer: BLUE CROSS/BLUE SHIELD | Admitting: Gastroenterology

## 2016-10-01 DIAGNOSIS — F3131 Bipolar disorder, current episode depressed, mild: Secondary | ICD-10-CM | POA: Diagnosis not present

## 2016-10-06 ENCOUNTER — Ambulatory Visit (INDEPENDENT_AMBULATORY_CARE_PROVIDER_SITE_OTHER): Payer: BLUE CROSS/BLUE SHIELD | Admitting: Nurse Practitioner

## 2016-10-06 ENCOUNTER — Encounter: Payer: Self-pay | Admitting: Nurse Practitioner

## 2016-10-06 VITALS — BP 110/68 | HR 97 | Temp 98.4°F | Ht 64.0 in | Wt 168.0 lb

## 2016-10-06 DIAGNOSIS — K219 Gastro-esophageal reflux disease without esophagitis: Secondary | ICD-10-CM | POA: Diagnosis not present

## 2016-10-06 MED ORDER — FAMOTIDINE 20 MG PO TABS: 40.0000 mg | ORAL_TABLET | Freq: Every day | ORAL | 3 refills | Status: AC

## 2016-10-06 MED ORDER — PANTOPRAZOLE SODIUM 40 MG PO TBEC: 40.0000 mg | DELAYED_RELEASE_TABLET | Freq: Every day | ORAL | 3 refills | Status: DC

## 2016-10-06 MED ORDER — FAMOTIDINE 20 MG PO TABS: 20.0000 mg | ORAL_TABLET | Freq: Every day | ORAL | 3 refills | Status: DC

## 2016-10-06 NOTE — Progress Notes (Signed)
Subjective:  Patient ID: Kim Lloyd, female    DOB: [birth year]  Age: 26 y.o. MRN: [id number]  CC: Follow-up (6 mo fu--still has heart burn but as bad)   Gastroesophageal Reflux  She complains of early satiety, heartburn and nausea. She reports no abdominal pain, no belching, no chest pain, no choking, no coughing, no dysphagia, no globus sensation, no hoarse voice, no sore throat, no stridor, no tooth decay, no water brash or no wheezing. This is a recurrent problem. The current episode started more than 1 year ago. The problem occurs constantly. The problem has been unchanged. The heartburn is located in the substernum and abdomen. The heartburn does not wake her from sleep. The heartburn does not limit her activity. The heartburn doesn't change with position. Pertinent negatives include no anemia, fatigue, melena, muscle weakness, orthopnea or weight loss. Risk factors include caffeine use. She has tried a PPI for the symptoms. The treatment provided no relief. Past procedures include H. pylori antibody titer.   Bipolar disorder: Denies any mood swings. No HI or SI.  Psychiatrist: Starling Manns, MD ([facility]) Psychologist: Merilyn Lloyd, counselor.  Has upcoming appt with GI: Kim Head, MD  Outpatient Medications Prior to Visit  Medication Sig Dispense Refill  . TRI-SPRINTEC 0.18/0.215/0.25 MG-35 MCG tablet Take 1 tablet by mouth daily. 1 Package 12  . omeprazole (PRILOSEC) 20 MG capsule TAKE ONE CAPSULE BY MOUTH DAILY 30 capsule 2  . doxycycline (VIBRAMYCIN) 100 MG capsule Take 1 capsule (100 mg total) by mouth 2 (two) times daily. (Patient not taking: Reported on 10/06/2016) 20 capsule 0  . ondansetron (ZOFRAN) 4 MG tablet Take 1 tablet (4 mg total) by mouth every 8 (eight) hours as needed for nausea or vomiting. (Patient not taking: Reported on 10/06/2016) 20 tablet 0  . predniSONE (DELTASONE) 10 MG tablet 40 mg x 3 days, 20 ,mg x 3 days , 10 mg x 3 days  (Patient not taking: Reported on 10/06/2016) 21 tablet 0   No facility-administered medications prior to visit.     ROS See HPI  Objective:  BP 110/68   Pulse 97   Temp 98.4 F (36.9 C)   Ht 5\' 4"  (1.626 m)   Wt 168 lb (76.2 kg)   SpO2 97%   BMI 28.84 kg/m   BP Readings from Last 3 Encounters:  10/06/16 110/68  05/07/16 100/72  04/08/16 102/69    Wt Readings from Last 3 Encounters:  10/06/16 168 lb (76.2 kg)  05/07/16 146 lb 3.2 oz (66.3 kg)  04/08/16 149 lb (67.6 kg)    Physical Exam  Constitutional: She is oriented to person, place, and time. No distress.  Cardiovascular: Normal rate.   Pulmonary/Chest: Effort normal.  Abdominal: Soft. Bowel sounds are normal. She exhibits no distension. There is no rebound and no guarding.  Neurological: She is alert and oriented to person, place, and time.  Skin: Skin is warm and dry.  Vitals reviewed.   Lab Results  Component Value Date   WBC 6.0 05/07/2016   HGB 12.6 05/07/2016   HCT 36.3 05/07/2016   PLT 250.0 05/07/2016   GLUCOSE 114 (H) 04/08/2016   ALT 9 04/08/2016   AST 10 04/08/2016   NA 137 04/08/2016   K 4.6 04/08/2016   CL 104 04/08/2016   CREATININE 0.81 04/08/2016   BUN 13 04/08/2016   CO2 28 04/08/2016   TSH 0.50 04/08/2016   INR 1.0 09/17/2012    Dg Chest 2  View  Result Date: 05/08/2016 CLINICAL DATA:  Cough for 4 days.  Fever for 6 days EXAM: CHEST  2 VIEW COMPARISON:  March 30, 2009 FINDINGS: There is airspace consolidation in a portion of the superior segment of the left lower lobe. Lungs elsewhere clear. Heart size and pulmonary vascularity are normal. No adenopathy. No bone lesions. IMPRESSION: Focal infiltrate in the superior segment left lower lobe consistent with pneumonia. Lungs elsewhere clear. No adenopathy. These results will be called to the ordering clinician or representative by the Radiologist Assistant, and communication documented in the PACS or zVision Dashboard. Electronically  Signed   By: Bretta BangWilliam  Woodruff III M.D.   On: 05/08/2016 08:21    Assessment & Plan:   Kim Lloyd was seen today for follow-up.  Diagnoses and all orders for this visit:  Gastroesophageal reflux disease, esophagitis presence not specified -     pantoprazole (PROTONIX) 40 MG tablet; Take 1 tablet (40 mg total) by mouth daily before breakfast. -     Discontinue: famotidine (PEPCID) 20 MG tablet; Take 1 tablet (20 mg total) by mouth at bedtime. -     famotidine (PEPCID) 20 MG tablet; Take 2 tablets (40 mg total) by mouth at bedtime.   I have discontinued Ms. Domino's predniSONE, doxycycline, ondansetron, and omeprazole. I have also changed her famotidine. Additionally, I am having her start on pantoprazole. Lastly, I am having her maintain her TRI-SPRINTEC, gabapentin, and asenapine.  Meds ordered this encounter  Medications  . gabapentin (NEURONTIN) 300 MG capsule    Sig: Take 300 mg by mouth 3 (three) times daily.    Refill:  0  . asenapine (SAPHRIS) 5 MG SUBL 24 hr tablet    Sig: Place 5 mg under the tongue 2 (two) times daily.  . pantoprazole (PROTONIX) 40 MG tablet    Sig: Take 1 tablet (40 mg total) by mouth daily before breakfast.    Dispense:  30 tablet    Refill:  3    Order Specific Question:   Supervising Provider    Answer:   Tresa GarterPLOTNIKOV, ALEKSEI V [[id number]]  . DISCONTD: famotidine (PEPCID) 20 MG tablet    Sig: Take 1 tablet (20 mg total) by mouth at bedtime.    Dispense:  30 tablet    Refill:  3    Order Specific Question:   Supervising Provider    Answer:   Tresa GarterPLOTNIKOV, ALEKSEI V [[id number]]  . famotidine (PEPCID) 20 MG tablet    Sig: Take 2 tablets (40 mg total) by mouth at bedtime.    Dispense:  60 tablet    Refill:  3    Order Specific Question:   Supervising Provider    Answer:   Tresa GarterPLOTNIKOV, ALEKSEI V [[id number]]    Follow-up: Return in about 6 months (around 04/08/2017) for CPE (fasting) and GERD follow up.  Alysia [place] Arslan Kier, NP

## 2016-10-06 NOTE — Progress Notes (Signed)
Pre visit review using our clinic review tool, if applicable. No additional management support is needed unless otherwise documented below in the visit note. 

## 2016-10-06 NOTE — Patient Instructions (Signed)
Maintain appt with GI for GERD and nausea follow up.   Food Choices for Gastroesophageal Reflux Disease, Adult When you have gastroesophageal reflux disease (GERD), the foods you eat and your eating habits are very important. Choosing the right foods can help ease the discomfort of GERD. Consider working with a diet and nutrition specialist (dietitian) to help you make healthy food choices. What general guidelines should I follow? Eating plan   Choose healthy foods low in fat, such as fruits, vegetables, whole grains, low-fat dairy products, and lean meat, fish, and poultry.  Eat frequent, small meals instead of three large meals each day. Eat your meals slowly, in a relaxed setting. Avoid bending over or lying down until 2-3 hours after eating.  Limit high-fat foods such as fatty meats or fried foods.  Limit your intake of oils, butter, and shortening to less than 8 teaspoons each day.  Avoid the following:  Foods that cause symptoms. These may be different for different people. Keep a food diary to keep track of foods that cause symptoms.  Alcohol.  Drinking large amounts of liquid with meals.  Eating meals during the 2-3 hours before bed.  Cook foods using methods other than frying. This may include baking, grilling, or broiling. Lifestyle    Maintain a healthy weight. Ask your health care provider what weight is healthy for you. If you need to lose weight, work with your health care provider to do so safely.  Exercise for at least 30 minutes on 5 or more days each week, or as told by your health care provider.  Avoid wearing clothes that fit tightly around your waist and chest.  Do not use any products that contain nicotine or tobacco, such as cigarettes and e-cigarettes. If you need help quitting, ask your health care provider.  Sleep with the head of your bed raised. Use a wedge under the mattress or blocks under the bed frame to raise the head of the bed. What foods are  not recommended? The items listed may not be a complete list. Talk with your dietitian about what dietary choices are best for you. Grains  Pastries or quick breads with added fat. [place] toast. Vegetables  Deep fried vegetables. [place] fries. Any vegetables prepared with added fat. Any vegetables that cause symptoms. For some people this may include tomatoes and tomato products, chili peppers, onions and garlic, and horseradish. Fruits  Any fruits prepared with added fat. Any fruits that cause symptoms. For some people this may include citrus fruits, such as oranges, grapefruit, pineapple, and lemons. Meats and other protein foods  High-fat meats, such as fatty beef or pork, hot dogs, ribs, ham, sausage, salami and bacon. Fried meat or protein, including fried fish and fried chicken. Nuts and nut butters. Dairy  Whole milk and chocolate milk. Sour cream. Cream. Ice cream. Cream cheese. Milk shakes. Beverages  Coffee and tea, with or without caffeine. Carbonated beverages. Sodas. Energy drinks. Fruit juice made with acidic fruits (such as orange or grapefruit). Tomato juice. Alcoholic drinks. Fats and oils  Butter. Margarine. Shortening. Ghee. Sweets and desserts  Chocolate and cocoa. Donuts. Seasoning and other foods  Pepper. Peppermint and spearmint. Any condiments, herbs, or seasonings that cause symptoms. For some people, this may include curry, hot sauce, or vinegar-based salad dressings. Summary  When you have gastroesophageal reflux disease (GERD), food and lifestyle choices are very important to help ease the discomfort of GERD.  Eat frequent, small meals instead of three large meals each  day. Eat your meals slowly, in a relaxed setting. Avoid bending over or lying down until 2-3 hours after eating.  Limit high-fat foods such as fatty meat or fried foods. This information is not intended to replace advice given to you by your health care provider. Make sure you discuss any  questions you have with your health care provider. Document Released: 07/06/2005 Document Revised: 07/07/2016 Document Reviewed: 07/07/2016 Elsevier Interactive Patient Education  2017 [employer]

## 2016-10-07 DIAGNOSIS — F39 Unspecified mood [affective] disorder: Secondary | ICD-10-CM | POA: Diagnosis not present

## 2016-10-07 DIAGNOSIS — F419 Anxiety disorder, unspecified: Secondary | ICD-10-CM | POA: Diagnosis not present

## 2016-10-15 DIAGNOSIS — F3131 Bipolar disorder, current episode depressed, mild: Secondary | ICD-10-CM | POA: Diagnosis not present

## 2016-10-19 DIAGNOSIS — F3131 Bipolar disorder, current episode depressed, mild: Secondary | ICD-10-CM | POA: Diagnosis not present

## 2016-10-28 DIAGNOSIS — F3131 Bipolar disorder, current episode depressed, mild: Secondary | ICD-10-CM | POA: Diagnosis not present

## 2016-11-03 DIAGNOSIS — F3131 Bipolar disorder, current episode depressed, mild: Secondary | ICD-10-CM | POA: Diagnosis not present

## 2016-11-11 ENCOUNTER — Encounter: Payer: Self-pay | Admitting: Gastroenterology

## 2016-11-11 ENCOUNTER — Ambulatory Visit (INDEPENDENT_AMBULATORY_CARE_PROVIDER_SITE_OTHER): Payer: BLUE CROSS/BLUE SHIELD | Admitting: Gastroenterology

## 2016-11-11 ENCOUNTER — Other Ambulatory Visit (INDEPENDENT_AMBULATORY_CARE_PROVIDER_SITE_OTHER): Payer: BLUE CROSS/BLUE SHIELD

## 2016-11-11 VITALS — BP 90/58 | HR 66 | Ht 64.5 in | Wt 163.0 lb

## 2016-11-11 DIAGNOSIS — K219 Gastro-esophageal reflux disease without esophagitis: Secondary | ICD-10-CM

## 2016-11-11 DIAGNOSIS — R1084 Generalized abdominal pain: Secondary | ICD-10-CM | POA: Diagnosis not present

## 2016-11-11 DIAGNOSIS — R140 Abdominal distension (gaseous): Secondary | ICD-10-CM

## 2016-11-11 DIAGNOSIS — K5900 Constipation, unspecified: Secondary | ICD-10-CM

## 2016-11-11 LAB — COMPREHENSIVE METABOLIC PANEL
ALK PHOS: 48 U/L (ref 39–117)
ALT: 10 U/L (ref 0–35)
AST: 11 U/L (ref 0–37)
Albumin: 4.2 g/dL (ref 3.5–5.2)
BUN: 8 mg/dL (ref 6–23)
CHLORIDE: 106 meq/L (ref 96–112)
CO2: 25 mEq/L (ref 19–32)
Calcium: 9.4 mg/dL (ref 8.4–10.5)
Creatinine, Ser: 0.84 mg/dL (ref 0.40–1.20)
GFR: 88.2 mL/min (ref >60.00)
Glucose, Bld: 93 mg/dL (ref 70–99)
POTASSIUM: 4.3 meq/L (ref 3.5–5.1)
Sodium: 138 mEq/L (ref 135–145)
TOTAL PROTEIN: 7.1 g/dL (ref 6.0–8.3)
Total Bilirubin: 1.2 mg/dL (ref 0.2–1.2)

## 2016-11-11 LAB — CBC WITH DIFFERENTIAL/PLATELET
BASOS PCT: 0.6 % (ref 0.0–3.0)
Basophils Absolute: 0 10*3/uL (ref 0.0–0.1)
EOS PCT: 1 % (ref 0.0–5.0)
Eosinophils Absolute: 0.1 10*3/uL (ref 0.0–0.7)
HCT: 37.5 % (ref 36.0–46.0)
Hemoglobin: 12.9 g/dL (ref 12.0–15.0)
LYMPHS ABS: 2.6 10*3/uL (ref 0.7–4.0)
Lymphocytes Relative: 47.2 % — ABNORMAL HIGH (ref 12.0–46.0)
MCHC: 34.5 g/dL (ref 30.0–36.0)
MCV: 86.8 fl (ref 78.0–100.0)
MONO ABS: 0.3 10*3/uL (ref 0.1–1.0)
MONOS PCT: 6.1 % (ref 3.0–12.0)
Neutro Abs: 2.5 10*3/uL (ref 1.4–7.7)
Neutrophils Relative %: 45.1 % (ref 43.0–77.0)
Platelets: 269 10*3/uL (ref 150.0–400.0)
RBC: 4.32 Mil/uL (ref 3.87–5.11)
RDW: 13.1 % (ref 11.5–15.5)
WBC: 5.4 10*3/uL (ref 4.0–10.5)

## 2016-11-11 LAB — IGA: IGA: 190 mg/dL (ref 68–378)

## 2016-11-11 LAB — TSH: TSH: 1.92 u[IU]/mL (ref 0.35–4.50)

## 2016-11-11 MED ORDER — GLYCOPYRROLATE 1 MG PO TABS: 1.0000 mg | ORAL_TABLET | Freq: Two times a day (BID) | ORAL | 11 refills | Status: DC

## 2016-11-11 NOTE — Progress Notes (Signed)
History of Present Illness: This is a 26 year old female referred for the evaluation of constipation, gas, abdominal pain/bloating, GERD, nausea. She relates constipation worsened substantially on Zyprexa. She is currently taking Neurontin 3 times a day and recently started Lamactil. She takes Colace to help with her constipation but it provides minimal relief. Pantoprazole has been effective in controlling heartburn symptoms. Denies weight loss, diarrhea, change in stool caliber, melena, hematochezia, nausea, vomiting, dysphagia, chest pain.   Allergies  Allergen Reactions  . Coconut Oil    Outpatient Medications Prior to Visit  Medication Sig Dispense Refill  . famotidine (PEPCID) 20 MG tablet Take 2 tablets (40 mg total) by mouth at bedtime. 60 tablet 3  . gabapentin (NEURONTIN) 300 MG capsule Take 300 mg by mouth 3 (three) times daily.  0  . pantoprazole (PROTONIX) 40 MG tablet Take 1 tablet (40 mg total) by mouth daily before breakfast. 30 tablet 3  . TRI-SPRINTEC 0.18/0.215/0.25 MG-35 MCG tablet Take 1 tablet by mouth daily. 1 Package 12  . asenapine (SAPHRIS) 5 MG SUBL 24 hr tablet Place 5 mg under the tongue 2 (two) times daily.     No facility-administered medications prior to visit.    Past Medical History:  Diagnosis Date  . Allergy   . Anxiety   . Bipolar 1 disorder (HCC)   . Depression   . Eating disorder    remission  . Gall bladder disease    gallbladder sluge which led to jaundice  . GERD (gastroesophageal reflux disease)   . History of chicken pox    age 6  . Migraine headache without aura   . Overdose drug    History reviewed. No pertinent surgical history. Social History   Social History  . Marital status: Single    Spouse name: N/A  . Number of children: N/A  . Years of education: N/A   Social History Main Topics  . Smoking status: Never Smoker  . Smokeless tobacco: Never Used  . Alcohol use 1.2 oz/week    2 Cans of beer per week  . Drug use:  No  . Sexual activity: Yes    Birth control/ protection: Pill   Other Topics Concern  . None   Social History Narrative  . None   Family History  Problem Relation Age of Onset  . Mental illness Mother   . Alcohol abuse Mother   . Mental illness Father   . Mental illness Brother   . Alcohol abuse Maternal Aunt   . Drug abuse Maternal Aunt   . Hypothyroidism Maternal Grandmother   . Coronary artery disease Maternal Grandfather       Review of Systems: Pertinent positive and negative review of systems were noted in the above HPI section. All other review of systems were otherwise negative.    Physical Exam: General: Well developed, well nourished, no acute distress Head: Normocephalic and atraumatic Eyes:  sclerae anicteric, EOMI Ears: Normal auditory acuity Mouth: No deformity or lesions Neck: Supple, no masses or thyromegaly Lungs: Clear throughout to auscultation Heart: Regular rate and rhythm; no murmurs, rubs or bruits Abdomen: Soft, mild diffuse tenderness and non distended. No masses, hepatosplenomegaly or hernias noted. Normal Bowel sounds Rectal: not done Musculoskeletal: Symmetrical with no gross deformities  Skin: No lesions on visible extremities Pulses:  Normal pulses noted Extremities: No clubbing, cyanosis, edema or deformities noted Neurological: Alert oriented x 4, grossly nonfocal Cervical Nodes:  No significant cervical adenopathy Inguinal Nodes: No significant  inguinal adenopathy Psychological:  Alert and cooperative. Normal mood and affect  Assessment and Recommendations:  1. Constipation, gas, abdominal pain, abdominal bloating, nausea and GERD. CMP, CBC, TSH, IgA, tTG. MiraLAX once or twice daily titrated for a complete bowel movement every day to every other day. Glycopyrrolate 1 mg by mouth twice a day. Gas-X 4 times a day when necessary. Continue pantoprazole 40 mg daily and follow standard antireflux measures. REV 6 weeks.

## 2016-11-11 NOTE — Patient Instructions (Signed)
Your physician has requested that you go to the basement for lab work before leaving today.  We have sent the following medications to your pharmacy for you to pick up at your convenience:robinul.   Start over the counter Miralax mixing 17 grams in 8 oz of water 1-2 x daily.   Patient advised to avoid spicy, acidic, citrus, chocolate, mints, fruit and fruit juices.  Limit the intake of caffeine, alcohol and Soda.  Don't exercise too soon after eating.  Don't lie down within 3-4 hours of eating.  Elevate the head of your bed.  Normal BMI (Body Mass Index- based on height and weight) is between 19 and 25. Your BMI today is Body mass index is 27.55 kg/m. Marland Kitchen Please consider follow up  regarding your BMI with your Primary Care Provider.  Thank you for choosing me and [facility].  Venita Lick. Pleas Koch., MD., Clementeen Graham

## 2016-11-12 DIAGNOSIS — F3131 Bipolar disorder, current episode depressed, mild: Secondary | ICD-10-CM | POA: Diagnosis not present

## 2016-11-12 LAB — TISSUE TRANSGLUTAMINASE, IGA: Tissue Transglutaminase Ab, IgA: 1 U/mL (ref <4)

## 2016-11-18 DIAGNOSIS — F3131 Bipolar disorder, current episode depressed, mild: Secondary | ICD-10-CM | POA: Diagnosis not present

## 2016-11-24 DIAGNOSIS — F3131 Bipolar disorder, current episode depressed, mild: Secondary | ICD-10-CM | POA: Diagnosis not present

## 2016-11-30 ENCOUNTER — Ambulatory Visit (INDEPENDENT_AMBULATORY_CARE_PROVIDER_SITE_OTHER): Payer: BLUE CROSS/BLUE SHIELD

## 2016-11-30 DIAGNOSIS — Z111 Encounter for screening for respiratory tuberculosis: Secondary | ICD-10-CM | POA: Diagnosis not present

## 2016-12-02 ENCOUNTER — Telehealth: Payer: Self-pay

## 2016-12-02 DIAGNOSIS — Z0184 Encounter for antibody response examination: Secondary | ICD-10-CM

## 2016-12-02 LAB — TB SKIN TEST
INDURATION: 0 mm
TB Skin Test: NEGATIVE

## 2016-12-02 NOTE — Telephone Encounter (Signed)
Patient either needs MMR and varicella vaccines or titer test for school requirements---she is checking with school rules and will call back to either schedule vaccines on nurse visit or go to lab for titer tests, I have ordered titer tests and were approved by dr Alain Marion

## 2016-12-02 NOTE — Telephone Encounter (Signed)
Error

## 2016-12-02 NOTE — Telephone Encounter (Signed)
error 

## 2016-12-08 ENCOUNTER — Telehealth: Payer: Self-pay

## 2016-12-08 DIAGNOSIS — F3131 Bipolar disorder, current episode depressed, mild: Secondary | ICD-10-CM | POA: Diagnosis not present

## 2016-12-08 DIAGNOSIS — Z1159 Encounter for screening for other viral diseases: Secondary | ICD-10-CM

## 2016-12-08 NOTE — Telephone Encounter (Signed)
error 

## 2016-12-08 NOTE — Telephone Encounter (Signed)
I have advised patient that orders have to be entered specifically for each titer test she needs, but all titers are possible for all vaccinations---I have orders already entered for 3 vaccines she needs titer testing for, also advised that insurance would probably not cover cost of titer testing, she can contact her insurance company to see what that out of pocket cost would be---orders for lab are good for one year, and she can come at anytime during lab hours for titer tests, patient also wanted to pick up copy of her immunization report, I have placed copy of that report at front desk

## 2016-12-08 NOTE — Telephone Encounter (Signed)
Pt called asking if a titer will check for all vaccines or does she need to specific in which vaccines she needs to be tested for? She specifically needs to be tested for Hep B, Varicella, and MMR.

## 2016-12-15 ENCOUNTER — Ambulatory Visit (INDEPENDENT_AMBULATORY_CARE_PROVIDER_SITE_OTHER): Payer: BLUE CROSS/BLUE SHIELD | Admitting: Gastroenterology

## 2016-12-15 ENCOUNTER — Encounter: Payer: Self-pay | Admitting: Gastroenterology

## 2016-12-15 VITALS — BP 94/64 | HR 76 | Ht 64.0 in | Wt 161.0 lb

## 2016-12-15 DIAGNOSIS — R1030 Lower abdominal pain, unspecified: Secondary | ICD-10-CM | POA: Diagnosis not present

## 2016-12-15 DIAGNOSIS — K219 Gastro-esophageal reflux disease without esophagitis: Secondary | ICD-10-CM

## 2016-12-15 DIAGNOSIS — R140 Abdominal distension (gaseous): Secondary | ICD-10-CM | POA: Diagnosis not present

## 2016-12-15 NOTE — Patient Instructions (Addendum)
Try to resume robinul 1/2 tablet twice daily.   Start a lactose free diet.   Thank you for choosing me and  [facility].  Venita LickMalcolm T. Pleas KochStark, Jr., MD., Clementeen GrahamFACG

## 2016-12-15 NOTE — Progress Notes (Signed)
    History of Present Illness: This is a 26 year old female returning with improved constipation and reflux controlled. She discontinued MiraLAX as she noted loose stools and she is having 4-5 bowel movements per week. She feels that her morning coffee helps with bowel movements. Abdominal pain, bloating and gas persist. Glycopyrrolate led to fatigue so she discontinued it. She is not clear whether it helped her abdominal pain or not. She takes a substantial amount of lactose products in her daily diet.  Current Medications, Allergies, Past Medical History, Past Surgical History, Family History and Social History were reviewed in [facility] electronic medical record.  Physical Exam: General: Well developed, well nourished, no acute distress Head: Normocephalic and atraumatic Eyes:  sclerae anicteric, EOMI Ears: Normal auditory acuity Mouth: No deformity or lesions Lungs: Clear throughout to auscultation Heart: Regular rate and rhythm; no murmurs, rubs or bruits Abdomen: Soft, mild lower abdominal tenderness and non distended. No masses, hepatosplenomegaly or hernias noted. Normal Bowel sounds Musculoskeletal: Symmetrical with no gross deformities  Pulses:  Normal pulses noted Extremities: No clubbing, cyanosis, edema or deformities noted Neurological: Alert oriented x 4, grossly nonfocal Psychological:  Alert and cooperative. Normal mood and affect  Assessment and Recommendations:  1. GERD. Continue pantoprazole 40 mg by mouth every morning and famotidine 40 mg by mouth at bedtime. Follow antireflux measures.  2. Abdominal pain and gas. Begin a lactose free diet for 7 days. Change glycopyrrolate to 0.5 mg by mouth twice a day when necessary for abdominal pain. If gas/abdominal pain persists begin Gas-X 4 times a day. REV in 4-6 weeks.

## 2016-12-17 DIAGNOSIS — F3131 Bipolar disorder, current episode depressed, mild: Secondary | ICD-10-CM | POA: Diagnosis not present

## 2016-12-21 DIAGNOSIS — H40033 Anatomical narrow angle, bilateral: Secondary | ICD-10-CM | POA: Diagnosis not present

## 2016-12-21 DIAGNOSIS — H1013 Acute atopic conjunctivitis, bilateral: Secondary | ICD-10-CM | POA: Diagnosis not present

## 2016-12-23 DIAGNOSIS — F4310 Post-traumatic stress disorder, unspecified: Secondary | ICD-10-CM | POA: Diagnosis not present

## 2017-01-01 ENCOUNTER — Ambulatory Visit (INDEPENDENT_AMBULATORY_CARE_PROVIDER_SITE_OTHER): Payer: BLUE CROSS/BLUE SHIELD | Admitting: Internal Medicine

## 2017-01-01 ENCOUNTER — Encounter: Payer: Self-pay | Admitting: Internal Medicine

## 2017-01-01 ENCOUNTER — Other Ambulatory Visit: Payer: Self-pay | Admitting: Nurse Practitioner

## 2017-01-01 ENCOUNTER — Ambulatory Visit: Payer: BLUE CROSS/BLUE SHIELD | Admitting: Internal Medicine

## 2017-01-01 VITALS — BP 110/70 | HR 83 | Ht 64.0 in | Wt 159.0 lb

## 2017-01-01 DIAGNOSIS — W5503XA Scratched by cat, initial encounter: Secondary | ICD-10-CM

## 2017-01-01 DIAGNOSIS — J309 Allergic rhinitis, unspecified: Secondary | ICD-10-CM | POA: Diagnosis not present

## 2017-01-01 DIAGNOSIS — R42 Dizziness and giddiness: Secondary | ICD-10-CM | POA: Diagnosis not present

## 2017-01-01 DIAGNOSIS — K219 Gastro-esophageal reflux disease without esophagitis: Secondary | ICD-10-CM

## 2017-01-01 DIAGNOSIS — J069 Acute upper respiratory infection, unspecified: Secondary | ICD-10-CM

## 2017-01-01 MED ORDER — AZITHROMYCIN 250 MG PO TABS: ORAL_TABLET | ORAL | 1 refills | Status: DC

## 2017-01-01 MED ORDER — MECLIZINE HCL 12.5 MG PO TABS: 12.5000 mg | ORAL_TABLET | Freq: Three times a day (TID) | ORAL | 1 refills | Status: DC | PRN

## 2017-01-01 MED ORDER — FEXOFENADINE HCL 180 MG PO TABS: 180.0000 mg | ORAL_TABLET | Freq: Every day | ORAL | 2 refills | Status: DC

## 2017-01-01 MED ORDER — TRIAMCINOLONE ACETONIDE 0.1 % EX CREA: 1.0000 "application " | TOPICAL_CREAM | Freq: Two times a day (BID) | CUTANEOUS | 0 refills | Status: AC

## 2017-01-01 NOTE — Patient Instructions (Signed)
Please take all new medication as prescribed - the antibiotic, allegra for allergies, meclizine for dizziness, and the cream for the skin redness  Please continue all other medications as before, and refills have been done if requested.  Please have the pharmacy call with any other refills you may need.  Please keep your appointments with your specialists as you may have planned

## 2017-01-01 NOTE — Progress Notes (Signed)
Subjective:    Patient ID: Kim Lloyd, female    DOB: 1990/07/28, 26 y.o.   MRN: [id number]  HPI   Here with 2-3 days acute onset fever, facial pain, pressure, headache, general weakness and malaise, dizziness with vertigo like feeling, with mild ST and cough, but pt denies chest pain, wheezing, increased sob or doe, orthopnea, PND, increased LE swelling, palpitations, dizziness or syncope. Does have several wks ongoing nasal allergy symptoms with clearish congestion, itch and sneezing, without fever, pain, ST, cough.  Also has multiple skin scratches she states due to cat scratches, no worsening redness or or pain, but concerned about when healed the scar seems slightly raised and "bubbly." Past Medical History:  Diagnosis Date  . Allergy   . Anxiety   . Bipolar 1 disorder (HCC)   . Depression   . Eating disorder    remission  . Gall bladder disease    gallbladder sluge which led to jaundice  . GERD (gastroesophageal reflux disease)   . History of chicken pox    age 7  . Migraine headache without aura   . Overdose drug    No past surgical history on file.  reports that she has never smoked. She has never used smokeless tobacco. She reports that she drinks about 1.2 oz of alcohol per week . She reports that she does not use drugs. family history includes Alcohol abuse in her maternal aunt and mother; Coronary artery disease in her maternal grandfather; Drug abuse in her maternal aunt; Hypothyroidism in her maternal grandmother; Mental illness in her brother, father, and mother. Allergies  Allergen Reactions  . Coconut Oil    Current Outpatient Prescriptions on File Prior to Visit  Medication Sig Dispense Refill  . famotidine (PEPCID) 20 MG tablet Take 2 tablets (40 mg total) by mouth at bedtime. 60 tablet 3  . gabapentin (NEURONTIN) 300 MG capsule Take 300 mg by mouth 3 (three) times daily.  0  . glycopyrrolate (ROBINUL) 1 MG tablet Take 1 tablet (1 mg total) by mouth 2 (two)  times daily. 60 tablet 11  . lamoTRIgine (LAMICTAL) 25 MG tablet Take 25 mg by mouth daily.    . TRI-SPRINTEC 0.18/0.215/0.25 MG-35 MCG tablet Take 1 tablet by mouth daily. 1 Package 12   No current facility-administered medications on file prior to visit.    Review of Systems  Constitutional: Negative for other unusual diaphoresis or sweats HENT: Negative for ear discharge or swelling Eyes: Negative for other worsening visual disturbances Respiratory: Negative for stridor or other swelling  Gastrointestinal: Negative for worsening distension or other blood Genitourinary: Negative for retention or other urinary change Musculoskeletal: Negative for other MSK pain or swelling Skin: Negative for color change or other new lesions Neurological: Negative for worsening tremors and other numbness  Psychiatric/Behavioral: Negative for worsening agitation or other fatigue All other system neg per pt    Objective:   Physical Exam BP 110/70   Pulse 83   Ht 5\' 4"  (1.626 m)   Wt 159 lb (72.1 kg)   LMP 12/10/2016 (Approximate)   SpO2 100%   BMI 27.29 kg/m  VS noted, mild ill Constitutional: Pt appears in NAD HENT: Head: NCAT.  Right Ear: External ear normal.  Left Ear: External ear normal.  Eyes: . Pupils are equal, round, and reactive to light. Conjunctivae and EOM are normal Nose: without d/c or deformity Bilat tm's with mild erythema.  Max sinus areas non tender.  Pharynx with mild erythema, no  exudate Neck: Neck supple. Gross normal ROM Cardiovascular: Normal rate and regular rhythm.   Pulmonary/Chest: Effort normal and breath sounds without rales or wheezing.  Neurological: Pt is alert. At baseline orientation, motor 5/5 intact, cn 2-12 intact Skin: Skin is warm. No rashes, has over 10 scratch lesions to the arms with normal appearing healing, no LE edema Psychiatric: Pt behavior is normal without agitation  No other exam findings    Assessment & Plan:

## 2017-01-02 NOTE — Assessment & Plan Note (Signed)
I think likely cat related, less likely harming herself with cutting I think, no evidence for cellulitis, reassured that she appears to have normal healing

## 2017-01-02 NOTE — Assessment & Plan Note (Signed)
/  Mild to mod, for allegra prn,  to f/u any worsening symptoms or concerns 

## 2017-01-02 NOTE — Assessment & Plan Note (Signed)
Mild to mod, for meclizine prn, to f/u any worsening symptoms or concerns 

## 2017-01-02 NOTE — Assessment & Plan Note (Signed)
Mild to mod, for antibx course,  to f/u any worsening symptoms or concerns 

## 2017-01-07 ENCOUNTER — Other Ambulatory Visit: Payer: BLUE CROSS/BLUE SHIELD

## 2017-01-07 DIAGNOSIS — Z0184 Encounter for antibody response examination: Secondary | ICD-10-CM | POA: Diagnosis not present

## 2017-01-08 LAB — VARICELLA ZOSTER ABS, IGG/IGM
Varicella IgM: <0.91 index (ref 0.00–0.90)
Varicella zoster IgG: 2735 index (ref >165)

## 2017-01-25 ENCOUNTER — Ambulatory Visit: Payer: BLUE CROSS/BLUE SHIELD | Admitting: Gastroenterology

## 2017-01-28 DIAGNOSIS — F4310 Post-traumatic stress disorder, unspecified: Secondary | ICD-10-CM | POA: Diagnosis not present

## 2017-02-03 DIAGNOSIS — F3131 Bipolar disorder, current episode depressed, mild: Secondary | ICD-10-CM | POA: Diagnosis not present

## 2017-02-04 DIAGNOSIS — F419 Anxiety disorder, unspecified: Secondary | ICD-10-CM | POA: Diagnosis not present

## 2017-02-04 DIAGNOSIS — F39 Unspecified mood [affective] disorder: Secondary | ICD-10-CM | POA: Diagnosis not present

## 2017-02-25 DIAGNOSIS — F3131 Bipolar disorder, current episode depressed, mild: Secondary | ICD-10-CM | POA: Diagnosis not present

## 2017-03-02 DIAGNOSIS — F3131 Bipolar disorder, current episode depressed, mild: Secondary | ICD-10-CM | POA: Diagnosis not present

## 2017-03-11 DIAGNOSIS — F3131 Bipolar disorder, current episode depressed, mild: Secondary | ICD-10-CM | POA: Diagnosis not present

## 2017-03-18 DIAGNOSIS — F3131 Bipolar disorder, current episode depressed, mild: Secondary | ICD-10-CM | POA: Diagnosis not present

## 2017-04-06 ENCOUNTER — Other Ambulatory Visit: Payer: Self-pay | Admitting: Nurse Practitioner

## 2017-04-06 DIAGNOSIS — K219 Gastro-esophageal reflux disease without esophagitis: Secondary | ICD-10-CM

## 2017-04-06 NOTE — Telephone Encounter (Signed)
rx sent in as requested. Pt need to see PCP for more refills.

## 2017-04-09 ENCOUNTER — Encounter: Payer: BLUE CROSS/BLUE SHIELD | Admitting: Nurse Practitioner

## 2017-04-15 ENCOUNTER — Telehealth: Payer: Self-pay | Admitting: Nurse Practitioner

## 2017-04-15 NOTE — Telephone Encounter (Signed)
Left vm for pt to call back  Pt needs to comes in for an appt for more eval--per [place].

## 2017-04-15 NOTE — Telephone Encounter (Signed)
Patient Name: Kim Lloyd DOB: 07-Feb-1991 Initial Comment Caller states she tried to rescue a feral cat yesterday and got bit on her arm. Nurse Assessment Nurse: Earlene Plater, RN, Lupita Leash Date/Time Lamount Cohen Time): 04/15/2017 12:09:56 PM Confirm and document reason for call. If symptomatic, describe symptoms. ---Caller states she tried to rescue a feral cat yesterday and got bit on her right forearm. Joints in arm are hurting and stiff. Site is red and swelling. 2/10. No fever. No rash. Denies any difficulty breathing and swallowing. Does the patient have any new or worsening symptoms? ---Yes Will a triage be completed? ---Yes Related visit to physician within the last 2 weeks? ---No Does the PT have any chronic conditions? (i.e. diabetes, asthma, etc.) ---No Is the patient pregnant or possibly pregnant? (Ask all females between the ages of 29-55) ---No Is this a behavioral health or substance abuse call? ---No Guidelines Guideline Title Affirmed Question Affirmed Notes Animal Bite [1] Any break in skin (e.g., cut, puncture or scratch) AND [2] dog, cat, or ferret at risk for RABIES (e.g., sick, stray, unprovoked bite, developing country) Final Disposition User Go to ED Now Earlene Plater, RN, Lupita Leash Comments Caller is going to contact her nearest UC to make sure they have the rabie vaccine. If not she is gong to the ER. Did not need appointment with the ER outcome. Referrals GO TO FACILITY UNDECIDED Caller Disagree/Comply Comply Caller Understands Yes PreDisposition Go to ED

## 2017-04-18 ENCOUNTER — Encounter (HOSPITAL_COMMUNITY): Payer: Self-pay | Admitting: Emergency Medicine

## 2017-04-18 ENCOUNTER — Emergency Department (HOSPITAL_COMMUNITY)
Admission: EM | Admit: 2017-04-18 | Discharge: 2017-04-18 | Disposition: A | Discharge status: Home / Self Care | Payer: BLUE CROSS/BLUE SHIELD | Attending: Emergency Medicine | Admitting: Emergency Medicine

## 2017-04-18 DIAGNOSIS — Z2914 Encounter for prophylactic rabies immune globin: Secondary | ICD-10-CM | POA: Diagnosis not present

## 2017-04-18 DIAGNOSIS — W5501XA Bitten by cat, initial encounter: Secondary | ICD-10-CM | POA: Insufficient documentation

## 2017-04-18 DIAGNOSIS — S51831A Puncture wound without foreign body of right forearm, initial encounter: Secondary | ICD-10-CM | POA: Diagnosis not present

## 2017-04-18 DIAGNOSIS — Z203 Contact with and (suspected) exposure to rabies: Secondary | ICD-10-CM | POA: Insufficient documentation

## 2017-04-18 DIAGNOSIS — Y999 Unspecified external cause status: Secondary | ICD-10-CM | POA: Insufficient documentation

## 2017-04-18 DIAGNOSIS — Y939 Activity, unspecified: Secondary | ICD-10-CM | POA: Diagnosis not present

## 2017-04-18 DIAGNOSIS — S41151A Open bite of right upper arm, initial encounter: Secondary | ICD-10-CM | POA: Diagnosis not present

## 2017-04-18 DIAGNOSIS — Z23 Encounter for immunization: Secondary | ICD-10-CM

## 2017-04-18 DIAGNOSIS — Y92009 Unspecified place in unspecified non-institutional (private) residence as the place of occurrence of the external cause: Secondary | ICD-10-CM | POA: Diagnosis not present

## 2017-04-18 MED ORDER — RABIES VACCINE, PCEC IM SUSR
1.0000 mL | Freq: Once | INTRAMUSCULAR | Status: AC
  Administered 2017-04-18: 1 mL via INTRAMUSCULAR
  Filled 2017-04-18: qty 1

## 2017-04-18 MED ORDER — AMOXICILLIN-POT CLAVULANATE 875-125 MG PO TABS: 1.0000 | ORAL_TABLET | Freq: Two times a day (BID) | ORAL | 0 refills | Status: DC

## 2017-04-18 MED ORDER — AMOXICILLIN-POT CLAVULANATE 875-125 MG PO TABS
1.0000 | ORAL_TABLET | Freq: Once | ORAL | Status: AC
  Administered 2017-04-18: 1 via ORAL
  Filled 2017-04-18: qty 1

## 2017-04-18 MED ORDER — RABIES IMMUNE GLOBULIN 150 UNIT/ML IM INJ
20.0000 [IU]/kg | INJECTION | Freq: Once | INTRAMUSCULAR | Status: AC
  Administered 2017-04-18: 1425 [IU] via INTRAMUSCULAR
  Filled 2017-04-18: qty 9.5

## 2017-04-18 NOTE — ED Triage Notes (Signed)
Patient reports cat bite from stray cat on Thursday morning. Puncture wound to right arm.

## 2017-04-18 NOTE — Discharge Instructions (Signed)
Your will need to follow up for the remainder of the rabies vaccine.
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
## 2017-04-26 ENCOUNTER — Ambulatory Visit: Payer: BLUE CROSS/BLUE SHIELD | Admitting: Gastroenterology

## 2017-04-26 ENCOUNTER — Telehealth: Payer: Self-pay

## 2017-04-26 NOTE — Telephone Encounter (Signed)
-----   Message from Holli Humbles sent at 04/26/2017  1:09 PM EDT ----- Pt canceled today bc of conflict with work :)

## 2017-04-26 NOTE — Telephone Encounter (Signed)
Yes charge 

## 2017-04-26 NOTE — Telephone Encounter (Signed)
Do you want to charge? 

## 2017-05-04 DIAGNOSIS — F3131 Bipolar disorder, current episode depressed, mild: Secondary | ICD-10-CM | POA: Diagnosis not present

## 2017-05-04 DIAGNOSIS — F419 Anxiety disorder, unspecified: Secondary | ICD-10-CM | POA: Diagnosis not present

## 2017-05-04 DIAGNOSIS — F39 Unspecified mood [affective] disorder: Secondary | ICD-10-CM | POA: Diagnosis not present

## 2017-05-04 NOTE — Telephone Encounter (Signed)
Pt Billed. °

## 2017-05-30 ENCOUNTER — Other Ambulatory Visit: Payer: Self-pay | Admitting: Nurse Practitioner

## 2017-06-22 DIAGNOSIS — F3131 Bipolar disorder, current episode depressed, mild: Secondary | ICD-10-CM | POA: Diagnosis not present

## 2017-07-06 DIAGNOSIS — F411 Generalized anxiety disorder: Secondary | ICD-10-CM | POA: Diagnosis not present

## 2017-07-21 DIAGNOSIS — F3131 Bipolar disorder, current episode depressed, mild: Secondary | ICD-10-CM | POA: Diagnosis not present

## 2017-07-21 DIAGNOSIS — F4310 Post-traumatic stress disorder, unspecified: Secondary | ICD-10-CM | POA: Diagnosis not present

## 2017-07-29 DIAGNOSIS — F4310 Post-traumatic stress disorder, unspecified: Secondary | ICD-10-CM | POA: Diagnosis not present

## 2017-07-29 DIAGNOSIS — F3131 Bipolar disorder, current episode depressed, mild: Secondary | ICD-10-CM | POA: Diagnosis not present

## 2017-08-05 DIAGNOSIS — F3131 Bipolar disorder, current episode depressed, mild: Secondary | ICD-10-CM | POA: Diagnosis not present

## 2017-08-18 DIAGNOSIS — L309 Dermatitis, unspecified: Secondary | ICD-10-CM | POA: Diagnosis not present

## 2017-08-18 DIAGNOSIS — Z23 Encounter for immunization: Secondary | ICD-10-CM | POA: Diagnosis not present

## 2017-09-22 DIAGNOSIS — F3131 Bipolar disorder, current episode depressed, mild: Secondary | ICD-10-CM | POA: Diagnosis not present

## 2017-10-08 DIAGNOSIS — F4310 Post-traumatic stress disorder, unspecified: Secondary | ICD-10-CM | POA: Diagnosis not present

## 2017-10-19 ENCOUNTER — Other Ambulatory Visit: Payer: Self-pay | Admitting: Nurse Practitioner

## 2017-10-19 DIAGNOSIS — K219 Gastro-esophageal reflux disease without esophagitis: Secondary | ICD-10-CM

## 2017-11-10 ENCOUNTER — Ambulatory Visit: Payer: BLUE CROSS/BLUE SHIELD | Admitting: Nurse Practitioner

## 2017-11-10 ENCOUNTER — Encounter: Payer: Self-pay | Admitting: Nurse Practitioner

## 2017-11-10 VITALS — BP 110/70 | HR 75 | Temp 98.2°F | Wt 153.4 lb

## 2017-11-10 DIAGNOSIS — R300 Dysuria: Secondary | ICD-10-CM

## 2017-11-10 LAB — POCT URINALYSIS DIPSTICK
BILIRUBIN UA: NEGATIVE
Glucose, UA: NEGATIVE
KETONES UA: NEGATIVE
Leukocytes, UA: NEGATIVE
Nitrite, UA: NEGATIVE
PH UA: 6 (ref 5.0–8.0)
Protein, UA: NEGATIVE
RBC UA: NEGATIVE
SPEC GRAV UA: 1.025 (ref 1.010–1.025)
UROBILINOGEN UA: 0.2 U/dL

## 2017-11-10 LAB — POCT URINE PREGNANCY: PREG TEST UR: NEGATIVE

## 2017-11-10 MED ORDER — PHENAZOPYRIDINE HCL 100 MG PO TABS: 100.0000 mg | ORAL_TABLET | Freq: Three times a day (TID) | ORAL | 0 refills | Status: DC | PRN

## 2017-11-10 NOTE — Patient Instructions (Addendum)
Urine sent for culture.  Urine pregnancy is negative.  Push oral hydration.  Dysuria Dysuria is pain or discomfort while urinating. The pain or discomfort may be felt in the tube that carries urine out of the bladder (urethra) or in the surrounding tissue of the genitals. The pain may also be felt in the groin area, lower abdomen, and lower back. You may have to urinate frequently or have the sudden feeling that you have to urinate (urgency). Dysuria can affect both men and women, but is more common in women. Dysuria can be caused by many different things, including:  Urinary tract infection in women.  Infection of the kidney or bladder.  Kidney stones or bladder stones.  Certain sexually transmitted infections (STIs), such as chlamydia.  Dehydration.  Inflammation of the vagina.  Use of certain medicines.  Use of certain soaps or scented products that cause irritation.  Follow these instructions at home: Watch your dysuria for any changes. The following actions may help to reduce any discomfort you are feeling:  Drink enough fluid to keep your urine clear or pale yellow.  Empty your bladder often. Avoid holding urine for long periods of time.  After a bowel movement or urination, women should cleanse from front to back, using each tissue only once.  Empty your bladder after sexual intercourse.  Take medicines only as directed by your health care provider.  If you were prescribed an antibiotic medicine, finish it all even if you start to feel better.  Avoid caffeine, tea, and alcohol. They can irritate the bladder and make dysuria worse. In men, alcohol may irritate the prostate.  Keep all follow-up visits as directed by your health care provider. This is important.  If you had any tests done to find the cause of dysuria, it is your responsibility to obtain your test results. Ask the lab or department performing the test when and how you will get your results. Talk with  your health care provider if you have any questions about your results.  Contact a health care provider if:  You develop pain in your back or sides.  You have a fever.  You have nausea or vomiting.  You have blood in your urine.  You are not urinating as often as you usually do. Get help right away if:  You pain is severe and not relieved with medicines.  You are unable to hold down any fluids.  You or someone else notices a change in your mental function.  You have a rapid heartbeat at rest.  You have shaking or chills.  You feel extremely weak. This information is not intended to replace advice given to you by your health care provider. Make sure you discuss any questions you have with your health care provider. Document Released: 04/03/2004 Document Revised: 12/12/2015 Document Reviewed: 03/01/2014 Elsevier Interactive Patient Education  [employer]2018 [employer]

## 2017-11-10 NOTE — Progress Notes (Signed)
Subjective:  Patient ID: Kim Lloyd, female    DOB: Sep 06, 1990  Age: 27 y.o. MRN: [id number]  CC: Urinary Tract Infection (yesterday urination frequency increased and then it hurt when she did with pain.)  Urinary Tract Infection   This is a new problem. The current episode started yesterday. The problem has been unchanged. The quality of the pain is described as burning. There has been no fever. She is sexually active. There is no history of pyelonephritis. Associated symptoms include frequency and urgency. Pertinent negatives include no chills, discharge, flank pain, hematuria, hesitancy, nausea, possible pregnancy, sweats or vomiting. She has tried nothing for the symptoms. There is no history of recurrent UTIs, urinary stasis or a urological procedure.   Outpatient Medications Prior to Visit  Medication Sig Dispense Refill  . famotidine (PEPCID) 20 MG tablet Take 2 tablets (40 mg total) by mouth at bedtime. 60 tablet 3  . gabapentin (NEURONTIN) 300 MG capsule Take 300 mg by mouth 3 (three) times daily.  0  . lamoTRIgine (LAMICTAL) 25 MG tablet Take 25 mg by mouth daily.    . pantoprazole (PROTONIX) 40 MG tablet TAKE ONE TABLET BY MOUTH EVERY MORNING BEFORE BREAKFAST 90 tablet 0  . triamcinolone cream (KENALOG) 0.1 % Apply 1 application topically 2 (two) times daily. 30 g 0  . TRI-SPRINTEC 0.18/0.215/0.25 MG-35 MCG tablet Take 1 tablet by mouth daily. (Patient not taking: Reported on 11/10/2017) 1 Package 12  . amoxicillin-clavulanate (AUGMENTIN) 875-125 MG tablet Take 1 tablet by mouth every 12 (twelve) hours. (Patient not taking: Reported on 11/10/2017) 14 tablet 0  . azithromycin (ZITHROMAX Z-PAK) 250 MG tablet 2 tab by mouth day 1, then 1 per day (Patient not taking: Reported on 11/10/2017) 6 tablet 1  . fexofenadine (ALLEGRA) 180 MG tablet Take 1 tablet (180 mg total) by mouth daily. (Patient not taking: Reported on 11/10/2017) 30 tablet 2  . glycopyrrolate (ROBINUL) 1 MG tablet Take  1 tablet (1 mg total) by mouth 2 (two) times daily. (Patient not taking: Reported on 11/10/2017) 60 tablet 11  . meclizine (ANTIVERT) 12.5 MG tablet Take 1 tablet (12.5 mg total) by mouth 3 (three) times daily as needed for dizziness. (Patient not taking: Reported on 11/10/2017) 30 tablet 1   No facility-administered medications prior to visit.     ROS See HPI  Objective:  BP 110/70 (BP Location: Right Arm, Patient Position: Sitting, Cuff Size: Normal)   Pulse 75   Temp 98.2 F (36.8 C) (Oral)   Wt 153 lb 6.4 oz (69.6 kg)   LMP 11/03/2017   SpO2 100%   BMI 26.33 kg/m   BP Readings from Last 3 Encounters:  11/10/17 110/70  04/18/17 108/67  01/01/17 110/70    Wt Readings from Last 3 Encounters:  11/10/17 153 lb 6.4 oz (69.6 kg)  04/18/17 157 lb (71.2 kg)  01/01/17 159 lb (72.1 kg)    Physical Exam  Constitutional: She is oriented to person, place, and time. No distress.  Cardiovascular: Normal rate.  Pulmonary/Chest: Effort normal.  Abdominal: Soft. Bowel sounds are normal. She exhibits no distension. There is no tenderness.  Negative CVA tenderness  Musculoskeletal: Normal range of motion.  Neurological: She is alert and oriented to person, place, and time.  Vitals reviewed.   Lab Results  Component Value Date   WBC 5.4 11/11/2016   HGB 12.9 11/11/2016   HCT 37.5 11/11/2016   PLT 269.0 11/11/2016   GLUCOSE 93 11/11/2016   ALT 10 11/11/2016  AST 11 11/11/2016   NA 138 11/11/2016   K 4.3 11/11/2016   CL 106 11/11/2016   CREATININE 0.84 11/11/2016   BUN 8 11/11/2016   CO2 25 11/11/2016   TSH 1.92 11/11/2016   INR 1.0 09/17/2012    No results found.  Assessment & Plan:   Kim Lloyd was seen today for urinary tract infection.  Diagnoses and all orders for this visit:  Dysuria -     Urine Culture -     POCT urine pregnancy -     phenazopyridine (PYRIDIUM) 100 MG tablet; Take 1 tablet (100 mg total) by mouth 3 (three) times daily as needed for pain (with  food). -     POCT Urinalysis Dipstick   I have discontinued Kim Lloyd's glycopyrrolate, azithromycin, fexofenadine, meclizine, and amoxicillin-clavulanate. I am also having her start on phenazopyridine. Additionally, I am having her maintain her TRI-SPRINTEC, gabapentin, famotidine, lamoTRIgine, triamcinolone cream, and pantoprazole.  Meds ordered this encounter  Medications  . phenazopyridine (PYRIDIUM) 100 MG tablet    Sig: Take 1 tablet (100 mg total) by mouth 3 (three) times daily as needed for pain (with food).    Dispense:  10 tablet    Refill:  0    Order Specific Question:   Supervising Provider    Answer:   Dianne Dun [[id number]]    Follow-up: No follow-ups on file.  Alysia Penna, NP

## 2017-11-11 LAB — URINE CULTURE
MICRO NUMBER:: 90501308
RESULT: NO GROWTH
SPECIMEN QUALITY: ADEQUATE

## 2017-11-15 ENCOUNTER — Encounter: Payer: BLUE CROSS/BLUE SHIELD | Admitting: Nurse Practitioner

## 2017-11-17 ENCOUNTER — Ambulatory Visit (INDEPENDENT_AMBULATORY_CARE_PROVIDER_SITE_OTHER): Payer: BLUE CROSS/BLUE SHIELD | Admitting: Nurse Practitioner

## 2017-11-17 ENCOUNTER — Encounter: Payer: Self-pay | Admitting: Nurse Practitioner

## 2017-11-17 ENCOUNTER — Other Ambulatory Visit (HOSPITAL_COMMUNITY)
Admission: RE | Admit: 2017-11-17 | Discharge: 2017-11-17 | Disposition: A | Discharge status: Home / Self Care | Payer: BLUE CROSS/BLUE SHIELD | Source: Ambulatory Visit | Attending: Nurse Practitioner | Admitting: Nurse Practitioner

## 2017-11-17 VITALS — BP 98/64 | HR 88 | Temp 98.5°F | Ht 64.0 in | Wt 147.0 lb

## 2017-11-17 DIAGNOSIS — Z0000 Encounter for general adult medical examination without abnormal findings: Secondary | ICD-10-CM | POA: Diagnosis not present

## 2017-11-17 DIAGNOSIS — Z124 Encounter for screening for malignant neoplasm of cervix: Secondary | ICD-10-CM

## 2017-11-17 DIAGNOSIS — N760 Acute vaginitis: Secondary | ICD-10-CM | POA: Diagnosis not present

## 2017-11-17 DIAGNOSIS — Z7251 High risk heterosexual behavior: Secondary | ICD-10-CM

## 2017-11-17 DIAGNOSIS — B373 Candidiasis of vulva and vagina: Secondary | ICD-10-CM | POA: Diagnosis not present

## 2017-11-17 DIAGNOSIS — Z13220 Encounter for screening for lipoid disorders: Secondary | ICD-10-CM

## 2017-11-17 DIAGNOSIS — Z30011 Encounter for initial prescription of contraceptive pills: Secondary | ICD-10-CM | POA: Diagnosis not present

## 2017-11-17 DIAGNOSIS — Z136 Encounter for screening for cardiovascular disorders: Secondary | ICD-10-CM

## 2017-11-17 LAB — COMPREHENSIVE METABOLIC PANEL
ALBUMIN: 4.4 g/dL (ref 3.5–5.2)
ALT: 10 U/L (ref 0–35)
AST: 9 U/L (ref 0–37)
Alkaline Phosphatase: 55 U/L (ref 39–117)
BUN: 11 mg/dL (ref 6–23)
CALCIUM: 9.1 mg/dL (ref 8.4–10.5)
CHLORIDE: 102 meq/L (ref 96–112)
CO2: 28 meq/L (ref 19–32)
CREATININE: 0.75 mg/dL (ref 0.40–1.20)
GFR: 99.7 mL/min (ref >60.00)
Glucose, Bld: 84 mg/dL (ref 70–99)
POTASSIUM: 3.9 meq/L (ref 3.5–5.1)
Sodium: 136 mEq/L (ref 135–145)
Total Bilirubin: 1 mg/dL (ref 0.2–1.2)
Total Protein: 6.6 g/dL (ref 6.0–8.3)

## 2017-11-17 LAB — CBC
HEMATOCRIT: 36.5 % (ref 36.0–46.0)
HEMOGLOBIN: 12.6 g/dL (ref 12.0–15.0)
MCHC: 34.6 g/dL (ref 30.0–36.0)
MCV: 86.5 fl (ref 78.0–100.0)
PLATELETS: 267 10*3/uL (ref 150.0–400.0)
RBC: 4.22 Mil/uL (ref 3.87–5.11)
RDW: 13.6 % (ref 11.5–15.5)
WBC: 5.1 10*3/uL (ref 4.0–10.5)

## 2017-11-17 LAB — LIPID PANEL
CHOL/HDL RATIO: 2
CHOLESTEROL: 123 mg/dL (ref 0–200)
HDL: 54.1 mg/dL (ref >39.00)
LDL CALC: 62 mg/dL (ref 0–99)
NonHDL: 69.14
TRIGLYCERIDES: 34 mg/dL (ref 0.0–149.0)
VLDL: 6.8 mg/dL (ref 0.0–40.0)

## 2017-11-17 LAB — POCT URINE PREGNANCY: PREG TEST UR: NEGATIVE

## 2017-11-17 MED ORDER — NORGESTIM-ETH ESTRAD TRIPHASIC 0.18/0.215/0.25 MG-25 MCG PO TABS: 1.0000 | ORAL_TABLET | Freq: Every day | ORAL | 3 refills | Status: DC
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
## 2017-11-17 NOTE — Patient Instructions (Addendum)
Start oral contraceptive, on first Sunday of starting menstrual cycle. Continue condom use to prevent STDs.  maintain heart healthy diet and regular exercise.  Use  suncreen to protect skin. Make appt with dermatology to assess skin moles.  Make appt for dental cleaning every 68month and eye exam very 1-25yr  Preventive Care for [place] The transition to life after high school as a young adult can be a stressful time with many changes. You may start seeing a primary care physician instead of a pediatrician. This is the time when your health care becomes your responsibility. Preventive care refers to lifestyle choices and visits with your health care provider that can promote health and wellness. What does preventive care include?  A yearly physical exam. This is also called an annual wellness visit.  Dental exams once or twice a year.  Routine eye exams. Ask your health care provider how often you should have your eyes checked.  Personal lifestyle choices, including: ? Daily care of your teeth and gums. ? Regular physical activity. ? Eating a healthy diet. ? Avoiding tobacco and drug use. ? Avoiding or limiting alcohol use. ? Practicing safe sex. ? Taking vitamin and mineral supplements as recommended by your health care provider. What happens during an annual wellness visit? Preventive care starts with a yearly visit to your primary care physician. The services and screenings done by your health care provider during your annual wellness visit will depend on your overall health, lifestyle risk factors, and family history of disease. Counseling Your health care provider may ask you questions about:  Past medical problems and your family's medical history.  Medicines or supplements you take.  Health insurance and access to health care.  Alcohol, tobacco, and drug use.  Your safety at home, work, or school.  Access to firearms.  Emotional well-being and how  you cope with stress.  Relationship well-being.  Diet, exercise, and sleep habits.  Your sexual health and activity.  Your methods of birth control.  Your menstrual cycle.  Your pregnancy history.  Screening You may have the following tests or measurements:  Height, weight, and BMI.  Blood pressure.  Lipid and cholesterol levels.  Tuberculosis skin test.  Skin exam.  Vision and hearing tests.  Screening test for hepatitis.  Screening tests for sexually transmitted diseases (STDs), if you are at risk.  BRCA-related cancer screening. This may be done if you have a family history of breast, ovarian, tubal, or peritoneal cancers.  Pelvic exam and Pap test. This may be done every 3 years starting at age 702 Vaccines Your health care provider may recommend certain vaccines, such as:  Influenza vaccine. This is recommended every year.  Tetanus, diphtheria, and acellular pertussis (Tdap, Td) vaccine. You may need a Td booster every 10 years.  Varicella vaccine. You may need this if you have not been vaccinated.  HPV vaccine. If you are 2647r younger, you may need three doses over 6 months.  Measles, mumps, and rubella (MMR) vaccine. You may need at least one dose of MMR. You may also need a second dose.  Pneumococcal 13-valent conjugate (PCV13) vaccine. You may need this if you have certain conditions and were not previously vaccinated.  Pneumococcal polysaccharide (PPSV23) vaccine. You may need one or two doses if you smoke cigarettes or if you have certain conditions.  Meningococcal vaccine. One dose is recommended if you are age 88-21 years and a fiMarket researcheriving in a residence hall, or  if you have one of several medical conditions. You may also need additional booster doses.  Hepatitis A vaccine. You may need this if you have certain conditions or if you travel or work in places where you may be exposed to hepatitis A.  Hepatitis B vaccine. You  may need this if you have certain conditions or if you travel or work in places where you may be exposed to hepatitis B.  Haemophilus influenzae type b (Hib) vaccine. You may need this if you have certain risk factors.  Talk to your health care provider about which screenings and vaccines you need and how often you need them. What steps can I take to develop healthy behaviors?  Have regular preventive health care visits with your primary care physician and dentist.  Eat a healthy diet.  Drink enough fluid to keep your urine clear or pale yellow.  Stay active. Exercise at least 30 minutes 5 or more days of the week.  Use alcohol responsibly.  Maintain a healthy weight.  Do not use any products that contain nicotine, such as cigarettes, chewing tobacco, and e-cigarettes. If you need help quitting, ask your health care provider.  Do not use drugs.  Practice safe sex.  Use birth control (contraception) to prevent unwanted pregnancy. If you plan to become pregnant, see your health care provider for a pre-conception visit.  Find healthy ways to manage stress. How can I protect myself from injury? Injuries from violence or accidents are the leading cause of death among young adults and can often be prevented. Take these steps to help protect yourself:  Always wear your seat belt while driving or riding in a vehicle.  Do not drive if you have been drinking alcohol. Do not ride with someone who has been drinking.  Do not drive when you are tired or distracted. Do not text while driving.  Wear a helmet and other protective equipment during sports activities.  If you have firearms in your house, make sure you follow all gun safety procedures.  Seek help if you have been bullied, physically abused, or sexually abused.  Use the Internet responsibly to avoid dangers such as online bullying and online sexual predators.  What can I do to cope with stress? Young adults may face many new  challenges that can be stressful, such as finding a job, going to college, moving away from home, managing money, being in a relationship, getting married, and having children. To manage stress:  Avoid known stressful situations when you can.  Exercise regularly.  Find a stress-reducing activity that works best for you. Examples include meditation, yoga, listening to music, or reading.  Spend time in nature.  Keep a journal to write about your stress and how you respond.  Talk to your health care provider about stress. He or she may suggest counseling.  Spend time with supportive friends or family.  Do not cope with stress by: ? Drinking alcohol or using drugs. ? Smoking cigarettes. ? Eating.  Where can I get more information? Learn more about preventive care and healthy habits from:  [place] and Gynecologists: KaraokeExchange.nl  U.[place]. Probation officer Task Force: http://herrera.net/  National Adolescent and [place]: StrategicRoad.nl  American Academy of Pediatrics Bright Futures: https://brightfutures.MemberVerification.co.za  Society for Adolescent Health and Medicine: MoralBlog.co.za.aspx  PodExchange.nl: ToyLending.fr  This information is not intended to replace advice given to you by your health care provider. Make sure you discuss any questions you have with your  health care provider. Document Released: 11/21/2015 Document Revised: 12/12/2015 Document Reviewed: 11/21/2015 Elsevier Interactive Patient Education  [employer].

## 2017-11-18 DIAGNOSIS — F3131 Bipolar disorder, current episode depressed, mild: Secondary | ICD-10-CM | POA: Diagnosis not present

## 2017-11-18 LAB — HIV ANTIBODY (ROUTINE TESTING W REFLEX): HIV 1&2 Ab, 4th Generation: NONREACTIVE

## 2017-11-18 LAB — RPR: RPR Ser Ql: NONREACTIVE

## 2017-11-19 LAB — CYTOLOGY - PAP
CANDIDA VAGINITIS: POSITIVE — AB
Chlamydia: NEGATIVE
Diagnosis: NEGATIVE
HPV (WINDOPATH): NOT DETECTED
Neisseria Gonorrhea: NEGATIVE
Trichomonas: NEGATIVE

## 2017-11-19 MED ORDER — FLUCONAZOLE 150 MG PO TABS: 150.0000 mg | ORAL_TABLET | Freq: Every day | ORAL | 0 refills | Status: DC

## 2017-11-19 NOTE — Addendum Note (Signed)
Addended by: Alysia Penna L on: 11/19/2017 03:46 PM   Modules accepted: Orders

## 2017-12-02 DIAGNOSIS — F4310 Post-traumatic stress disorder, unspecified: Secondary | ICD-10-CM | POA: Diagnosis not present

## 2017-12-02 DIAGNOSIS — F3131 Bipolar disorder, current episode depressed, mild: Secondary | ICD-10-CM | POA: Diagnosis not present

## 2017-12-20 DIAGNOSIS — H04123 Dry eye syndrome of bilateral lacrimal glands: Secondary | ICD-10-CM | POA: Diagnosis not present

## 2017-12-20 DIAGNOSIS — H40033 Anatomical narrow angle, bilateral: Secondary | ICD-10-CM | POA: Diagnosis not present

## 2017-12-24 DIAGNOSIS — F419 Anxiety disorder, unspecified: Secondary | ICD-10-CM | POA: Diagnosis not present

## 2017-12-24 DIAGNOSIS — F330 Major depressive disorder, recurrent, mild: Secondary | ICD-10-CM | POA: Diagnosis not present

## 2017-12-27 DIAGNOSIS — F4310 Post-traumatic stress disorder, unspecified: Secondary | ICD-10-CM | POA: Diagnosis not present

## 2017-12-30 ENCOUNTER — Telehealth: Payer: Self-pay | Admitting: Nurse Practitioner

## 2017-12-30 NOTE — Telephone Encounter (Signed)
Pt request to transfer, both provider please advise.  FYI, [facility] is out of the office until 01/2018   Copied from CRM [phone]. Topic: Appointment Scheduling - Scheduling Inquiry for Clinic >> Dec 30, 2017  2:53 PM Oneal GroutSebastian, Jennifer S wrote: Reason for CRM: [facility]. Please advise

## 2018-01-09 NOTE — Telephone Encounter (Signed)
Ok to transfer. 

## 2018-01-10 NOTE — Telephone Encounter (Signed)
Dr. Salomon FickBanks please advise, pt request to transfer to you.  [facility] is okey for the transfer request.

## 2018-01-10 NOTE — Telephone Encounter (Signed)
Ok

## 2018-01-13 NOTE — Telephone Encounter (Signed)
CRM created. 

## 2018-01-13 NOTE — Telephone Encounter (Signed)
Called pt left a detailed message to return my call in the office and schedule an appointment to establish care with Dr Salomon FickBanks.

## 2018-01-16 ENCOUNTER — Other Ambulatory Visit: Payer: Self-pay | Admitting: Nurse Practitioner

## 2018-01-16 DIAGNOSIS — K219 Gastro-esophageal reflux disease without esophagitis: Secondary | ICD-10-CM

## 2018-01-21 NOTE — Telephone Encounter (Signed)
Pt is scheduled for 02/02/18 with Dr. Salomon FickBanks.  No further action required.

## 2018-02-02 ENCOUNTER — Ambulatory Visit: Payer: BLUE CROSS/BLUE SHIELD | Admitting: Family Medicine

## 2018-02-02 ENCOUNTER — Encounter: Payer: Self-pay | Admitting: Family Medicine

## 2018-02-02 VITALS — BP 100/72 | HR 66 | Temp 98.5°F | Wt 149.0 lb

## 2018-02-02 DIAGNOSIS — F3281 Premenstrual dysphoric disorder: Secondary | ICD-10-CM | POA: Diagnosis not present

## 2018-02-02 MED ORDER — LEVONORGEST-ETH ESTRAD 91-DAY 0.15-0.03 &0.01 MG PO TABS: 1.0000 | ORAL_TABLET | Freq: Every day | ORAL | 4 refills | Status: DC

## 2018-02-02 NOTE — Patient Instructions (Addendum)
Premenstrual Syndrome Premenstrual syndrome (PMS) is a group of physical, emotional, and behavioral symptoms that affect women of childbearing age. PMS starts 1-2 weeks before the start of a woman's period and goes away a few days after the period starts. It often recurs in a predictable pattern. PMS can range from mild to severe. When it is severe, it is called premenstrual dysphoric disorder (PMDD). PMS can interfere in many ways with normal daily activities. What are the causes? The cause of this condition is not known, but it seems to be related to hormone changes that happen before menstruation. What are the signs or symptoms? Symptoms of this condition often happen every month. They go away completely after your period starts. Physical symptoms include:  Bloating.  Breast pain.  Headaches.  Extreme fatigue.  Backaches.  Swelling of the hands and feet.  Weight gain.  Hot flashes.  Emotional and behavioral symptoms include:  Mood swings.  Depression.  Angry outbursts.  Irritability.  Anxiety.  Crying spells.  Food cravings or appetite changes.  Changes in sexual desire.  Confusion.  Aggression.  Social withdrawal.  Poor concentration.  How is this diagnosed? This condition is diagnosed if symptoms of PMS:  Are present in the 5 days before your period starts.  End within 4 days after your period starts.  Happen at least 3 months in a row.  Interfere with some of your normal activities.  Other conditions that can cause some of these symptoms must be ruled out before PMS can be diagnosed. How is this treated? This condition may be treated by:  Maintaining a healthy lifestyle. This includes eating a balanced diet and exercising regularly.  Taking medicines. Medicines can help relieve symptoms such as cramps, aches, pains, headaches, and breast tenderness. Depending on the severity of the condition, your health care provider may  recommend: ? Over-the-counter pain medicines. ? Prescription medicines for PMDD.  Follow these instructions at home: Eating and drinking   Eat a well-balanced diet.  Avoid caffeine and alcohol.  Limit the amount of salt and salty foods you eat. This will help lessen bloating.  Drink enough fluid to keep your urine clear or pale yellow.  Take a multivitamin if told to by your health care provider. Lifestyle  Do not use any tobacco products, such as cigarettes, chewing tobacco, and e-cigarettes. If you need help quitting, ask your health care provider.  Exercise regularly as suggested by your health care provider.  Get enough sleep.  Practice relaxation techniques.  Limit stress. Other Instructions  For 2-3 months, write down your symptoms, their severity, and how long they last. This will help your health care provider choose the best treatment for you.  Take over-the-counter and prescription medicines only as told by your health care provider.  If you are using oral contraceptive pills, use them as told by your health care provider. This information is not intended to replace advice given to you by your health care provider. Make sure you discuss any questions you have with your health care provider. Document Released: 07/03/2000 Document Revised: 08/07/2015 Document Reviewed: 04/05/2015 Elsevier Interactive Patient Education  2018 [employer]. Ethinyl Estradiol; Levonorgestrel tablets What is this medicine? ETHINYL ESTRADIOL; LEVONORGESTREL (ETH in il es tra DYE ole; LEE voh nor jes trel) is an oral contraceptive. It combines two types of female hormones, an estrogen and a progestin. They are used to prevent ovulation and pregnancy. This medicine may be used for other purposes; ask your health care provider or  pharmacist if you have questions. COMMON BRAND NAME(S): [place], [place], Amethia, Amethia Lo, [place], [place], [place], Aviane, Camrese, Camrese Lo, [place],  [place], [address], [place], [place], [place], [place], [place], [place], [place], [place], [place], [place], [place], Levonorgestrel/Ethinyl Estradiol, [place], [place], [place], [place], [place], [place], [place], [place], [place], [place], [place], [place], [place], [place], [place], [place], [place], Debroah Baller What should I tell my health care provider before I take this medicine? They need to know if you have or ever had any of these conditions: -abnormal vaginal bleeding -blood vessel disease or blood clots -breast, cervical, endometrial, ovarian, liver, or uterine cancer -diabetes -gallbladder disease -heart disease or recent heart attack -high blood pressure -high cholesterol -kidney disease -liver disease -migraine headaches -stroke -systemic lupus erythematosus (SLE) -tobacco smoker -an unusual or allergic reaction to estrogens, progestins, other medicines, foods, dyes, or preservatives -pregnant or trying to get pregnant -breast-feeding How should I use this medicine? Take this medicine by mouth. To reduce nausea, this medicine may be taken with food. Follow the directions on the prescription label. Take this medicine at the same time each day and in the order directed on the package. Do not take your medicine more often than directed. Contact your pediatrician regarding the use of this medicine in children. Special care may be needed. This medicine has been used in female children who have started having menstrual periods. A patient package insert for the product will be given with each prescription and refill. Read this sheet carefully each time. The sheet may change frequently. Overdosage: If you think you have taken too much of this medicine contact a poison control center or emergency room at once. NOTE: This medicine is only for you. Do not share this medicine with others. What if I miss a dose? If you miss a dose, refer to the patient information  sheet you received with your medicine for direction. If you miss more than one pill, this medicine may not be as effective and you may need to use another form of birth control. What may interact with this medicine? Do not take this medicine with the following medication: -dasabuvir; ombitasvir; paritaprevir; ritonavir -ombitasvir; paritaprevir; ritonavir This medicine may also interact with the following medications: -acetaminophen -antibiotics or medicines for infections, especially rifampin, rifabutin, rifapentine, and griseofulvin, and possibly penicillins or tetracyclines -aprepitant -ascorbic acid (vitamin C) -atorvastatin -barbiturate medicines, such as phenobarbital -bosentan -carbamazepine -caffeine -clofibrate -cyclosporine -dantrolene -doxercalciferol -felbamate -grapefruit juice -hydrocortisone -medicines for anxiety or sleeping problems, such as diazepam or temazepam -medicines for diabetes, including pioglitazone -mineral oil -modafinil -mycophenolate -nefazodone -oxcarbazepine -phenytoin -prednisolone -ritonavir or other medicines for HIV infection or AIDS -rosuvastatin -selegiline -soy isoflavones supplements -St. John's wort -tamoxifen or raloxifene -theophylline -thyroid hormones -topiramate -warfarin This list may not describe all possible interactions. Give your health care provider a list of all the medicines, herbs, non-prescription drugs, or dietary supplements you use. Also tell them if you smoke, drink alcohol, or use illegal drugs. Some items may interact with your medicine. What should I watch for while using this medicine? Visit your doctor or health care professional for regular checks on your progress. You will need a regular breast and pelvic exam and Pap smear while on this medicine. Use an additional method of contraception during the first cycle that you take these tablets. If you have any reason to think you are pregnant, stop taking  this medicine right away and contact your doctor or health care professional. If you are taking this medicine for hormone related problems, it may take  several cycles of use to see improvement in your condition. Smoking increases the risk of getting a blood clot or having a stroke while you are taking birth control pills, especially if you are more than 26 years old. You are strongly advised not to smoke. This medicine can make your body retain fluid, making your fingers, hands, or ankles swell. Your blood pressure can go up. Contact your doctor or health care professional if you feel you are retaining fluid. This medicine can make you more sensitive to the sun. Keep out of the sun. If you cannot avoid being in the sun, wear protective clothing and use sunscreen. Do not use sun lamps or tanning beds/booths. If you wear contact lenses and notice visual changes, or if the lenses begin to feel uncomfortable, consult your eye care specialist. In some women, tenderness, swelling, or minor bleeding of the gums may occur. Notify your dentist if this happens. Brushing and flossing your teeth regularly may help limit this. See your dentist regularly and inform your dentist of the medicines you are taking. If you are going to have elective surgery, you may need to stop taking this medicine before the surgery. Consult your health care professional for advice. This medicine does not protect you against HIV infection (AIDS) or any other sexually transmitted diseases. What side effects may I notice from receiving this medicine? Side effects that you should report to your doctor or health care professional as soon as possible: -breast tissue changes or discharge -changes in vaginal bleeding during your period or between your periods -chest pain -coughing up blood -dizziness or fainting spells -headaches or migraines -leg, arm or groin pain -severe or sudden headaches -stomach pain (severe) -sudden shortness of  breath -sudden loss of coordination, especially on one side of the body -speech problems -symptoms of vaginal infection like itching, irritation or unusual discharge -tenderness in the upper abdomen -vomiting -weakness or numbness in the arms or legs, especially on one side of the body -yellowing of the eyes or skin Side effects that usually do not require medical attention (report to your doctor or health care professional if they continue or are bothersome): -breakthrough bleeding and spotting that continues beyond the 3 initial cycles of pills -breast tenderness -mood changes, anxiety, depression, frustration, anger, or emotional outbursts -increased sensitivity to sun or ultraviolet light -nausea -skin rash, acne, or brown spots on the skin -weight gain (slight) This list may not describe all possible side effects. Call your doctor for medical advice about side effects. You may report side effects to FDA at 1-800-FDA-1088. Where should I keep my medicine? Keep out of the reach of children. Store at room temperature between 15 and 30 degrees C (59 and 86 degrees F). Throw away any unused medicine after the expiration date. NOTE: This sheet is a summary. It may not cover all possible information. If you have questions about this medicine, talk to your doctor, pharmacist, or health care provider.  2018 Elsevier/Gold Standard (2016-03-16 07:58:22)

## 2018-02-02 NOTE — Progress Notes (Signed)
Subjective:    Patient ID: Kim Lloyd, female    DOB: [birth year], 27 y.o.   MRN: [id number]  No chief complaint on file.   HPI Patient with pmh sig for bipolar depression I, h/o migraine without aura, insomnia was seen today for f/u on ongoing concern and transfer of care previously [facility]    PMDD: -pt notes increased depression prior and during menses. -in the past pt has been on nexplanon but had is removed early 2/2 irregular bleeding. -pt was also on monthly OCPs that she was advised to take continuously, but the pills had decreasing hormone levels which caused bleeding d/t the hormone fluctuation -pt is seeing a therapist and has a psychiatrist. -currently taking lamictal 25 mg -pt is not currently having sex, but does use protection.  Past Medical History:  Diagnosis Date  . Allergy   . Anxiety   . Bipolar 1 disorder (HCC)   . Depression   . Eating disorder    remission  . Gall bladder disease    gallbladder sluge which led to jaundice  . GERD (gastroesophageal reflux disease)   . History of chicken pox    age 89  . Migraine headache without aura   . Overdose drug     Allergies  Allergen Reactions  . Coconut Oil     ROS General: Denies fever, chills, night sweats, changes in weight, changes in appetite HEENT: Denies headaches, ear pain, changes in vision, rhinorrhea, sore throat CV: Denies CP, palpitations, SOB, orthopnea Pulm: Denies SOB, cough, wheezing GI: Denies abdominal pain, nausea, vomiting, diarrhea, constipation GU: Denies dysuria, hematuria, frequency, vaginal discharge Msk: Denies muscle cramps, joint pains Neuro: Denies weakness, numbness, tingling Skin: Denies rashes, bruising Psych: Denies depression, anxiety, hallucinations  +PMDD     Objective:    Blood pressure 100/72, pulse 66, temperature 98.5 F (36.9 C), temperature source Oral, weight 149 lb (67.6 kg), last menstrual period 01/20/2018, SpO2 99  %.   Gen. Pleasant, well-nourished, in no distress, normal affect  HEENT: [place]/AT, face symmetric, no scleral icterus, PERRLA, nares patent without drainage Lungs: no accessory muscle use, CTAB, no wheezes or rales Cardiovascular: RRR, no m/r/g, no peripheral edema Neuro:  A&Ox3, CN II-XII intact, normal gait   Wt Readings from Last 3 Encounters:  02/02/18 149 lb (67.6 kg)  11/17/17 147 lb (66.7 kg)  11/10/17 153 lb 6.4 oz (69.6 kg)    Lab Results  Component Value Date   WBC 5.1 11/17/2017   HGB 12.6 11/17/2017   HCT 36.5 11/17/2017   PLT 267.0 11/17/2017   GLUCOSE 84 11/17/2017   CHOL 123 11/17/2017   TRIG 34.0 11/17/2017   HDL 54.10 11/17/2017   LDLCALC 62 11/17/2017   ALT 10 11/17/2017   AST 9 11/17/2017   NA 136 11/17/2017   K 3.9 11/17/2017   CL 102 11/17/2017   CREATININE 0.75 11/17/2017   BUN 11 11/17/2017   CO2 28 11/17/2017   TSH 1.92 11/11/2016   INR 1.0 09/17/2012    Assessment/Plan:  PMDD (premenstrual dysphoric disorder)  -pap up to date done 11/17/17 -discussed various birth control options.   -discussed making an appt with OB/Gyn if interested in IUD.  Pt wishes to try continuous OCPs. - Plan: Levonorgestrel-Ethinyl Estradiol (AMETHIA,CAMRESE) 0.15-0.03 &0.01 MG tablet  F/u in the next 1-2 months, sooner if needed.  Abbe AmsterdamShannon Tahani Potier, MD

## 2018-02-03 DIAGNOSIS — F4310 Post-traumatic stress disorder, unspecified: Secondary | ICD-10-CM | POA: Diagnosis not present

## 2018-02-08 DIAGNOSIS — D225 Melanocytic nevi of trunk: Secondary | ICD-10-CM | POA: Diagnosis not present

## 2018-02-08 DIAGNOSIS — D224 Melanocytic nevi of scalp and neck: Secondary | ICD-10-CM | POA: Diagnosis not present

## 2018-03-24 DIAGNOSIS — F330 Major depressive disorder, recurrent, mild: Secondary | ICD-10-CM | POA: Diagnosis not present

## 2018-03-24 DIAGNOSIS — F419 Anxiety disorder, unspecified: Secondary | ICD-10-CM | POA: Diagnosis not present

## 2018-04-20 ENCOUNTER — Other Ambulatory Visit: Payer: Self-pay | Admitting: Nurse Practitioner

## 2018-04-20 DIAGNOSIS — K219 Gastro-esophageal reflux disease without esophagitis: Secondary | ICD-10-CM

## 2018-04-20 NOTE — Telephone Encounter (Signed)
Please request refills.

## 2018-05-27 IMAGING — DX DG CHEST 2V
2 series · 2 of 2 positions shown · non-contrast
Comparison: March 30, 2009

CLINICAL DATA: Cough for 4 days.  Fever for 6 days

EXAM:
CHEST  2 VIEW

[chest pa]
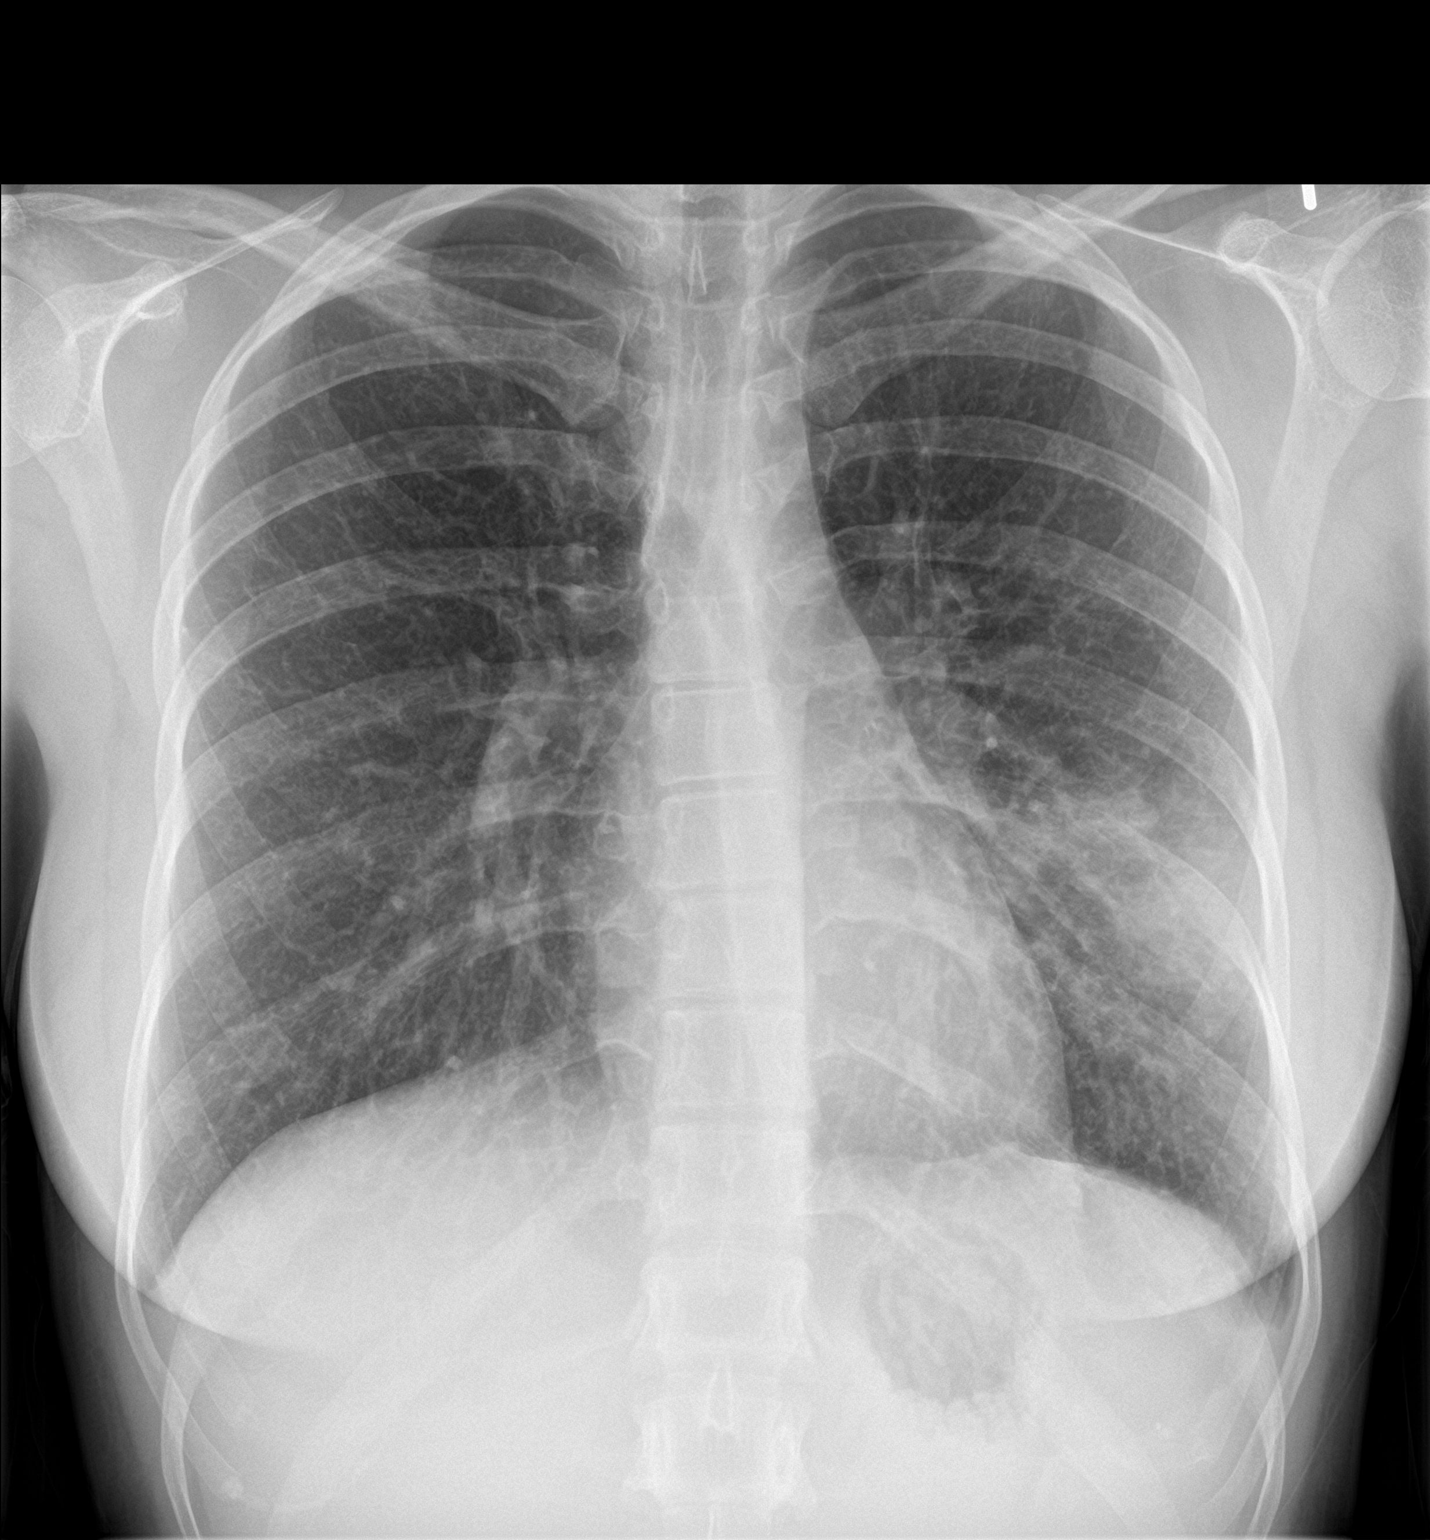

[chest lat]
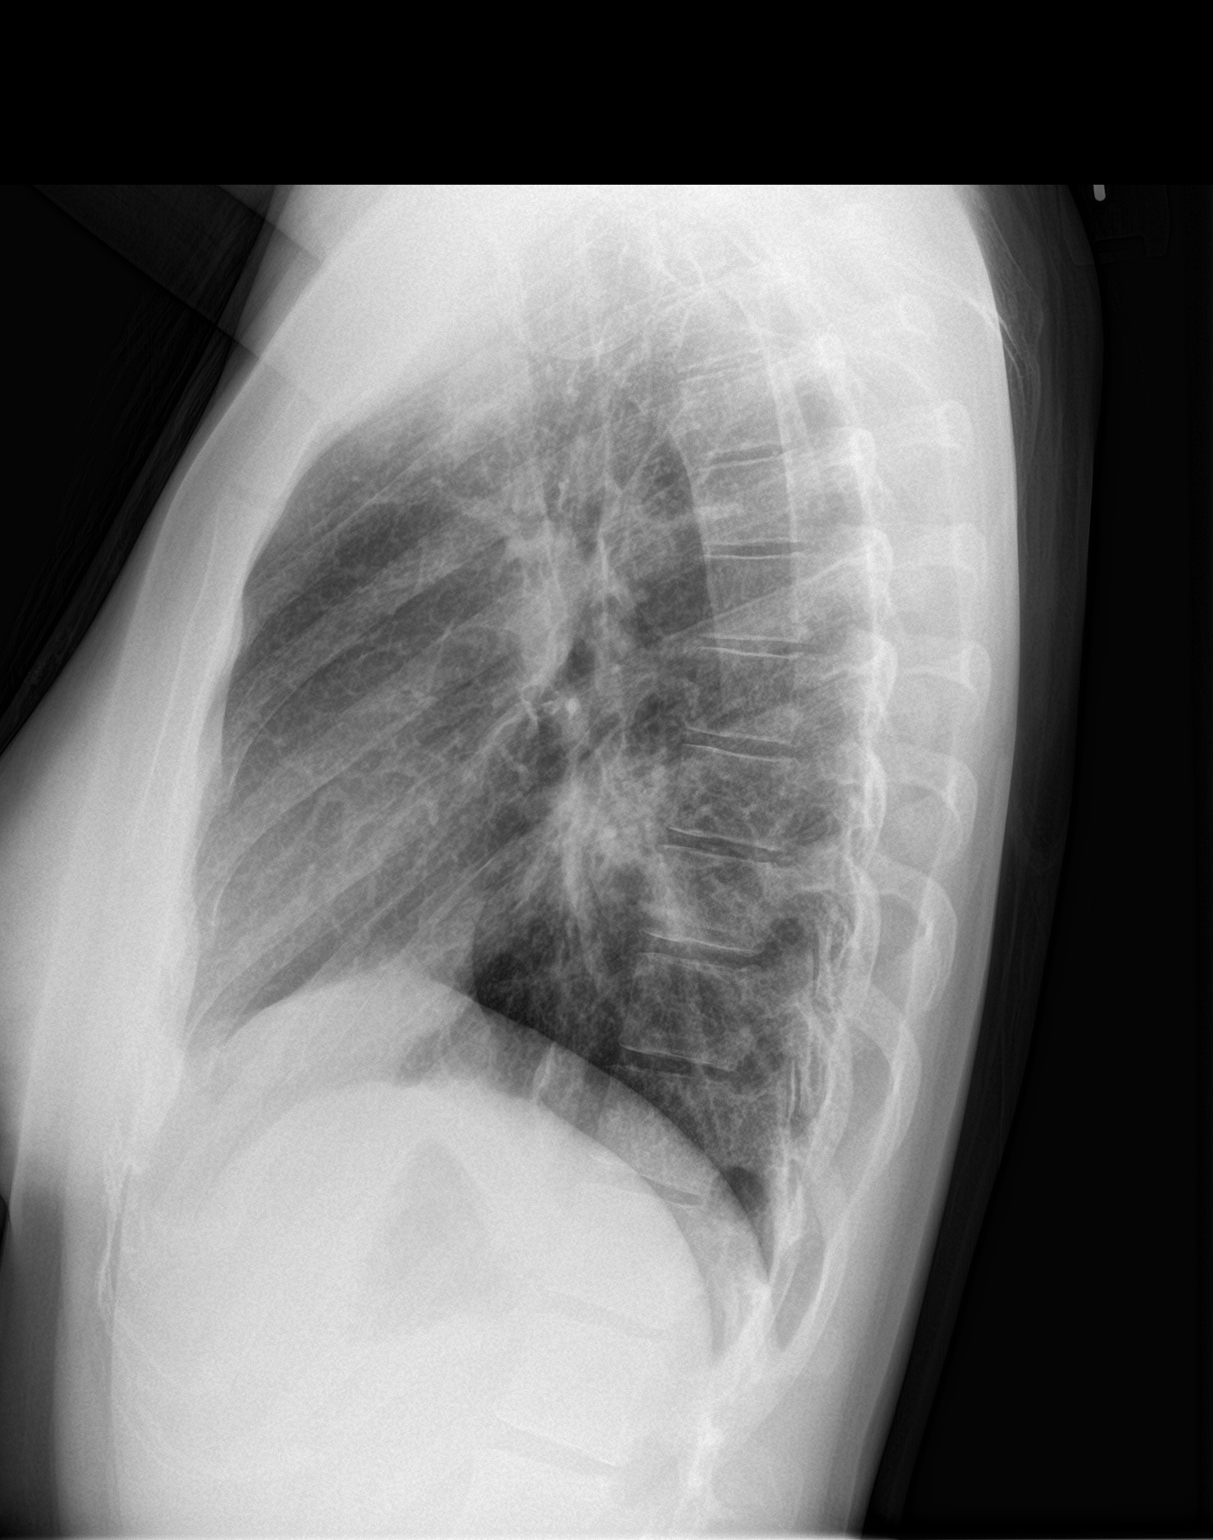

[2 of 2 positions shown; findings below may reference images not displayed]

FINDINGS: There is airspace consolidation in a portion of the superior segment
of the left lower lobe. Lungs elsewhere clear. Heart size and
pulmonary vascularity are normal. No adenopathy. No bone lesions.
IMPRESSION: Focal infiltrate in the superior segment left lower lobe consistent
with pneumonia. Lungs elsewhere clear. No adenopathy.

These results will be called to the ordering clinician or
representative by the Radiologist Assistant, and communication
documented in the PACS or zVision Dashboard.

## 2018-06-02 DIAGNOSIS — L814 Other melanin hyperpigmentation: Secondary | ICD-10-CM | POA: Diagnosis not present

## 2018-06-02 DIAGNOSIS — D225 Melanocytic nevi of trunk: Secondary | ICD-10-CM | POA: Diagnosis not present

## 2018-06-02 DIAGNOSIS — D1722 Benign lipomatous neoplasm of skin and subcutaneous tissue of left arm: Secondary | ICD-10-CM | POA: Diagnosis not present

## 2018-06-02 DIAGNOSIS — D224 Melanocytic nevi of scalp and neck: Secondary | ICD-10-CM | POA: Diagnosis not present

## 2018-06-02 DIAGNOSIS — D2261 Melanocytic nevi of right upper limb, including shoulder: Secondary | ICD-10-CM | POA: Diagnosis not present

## 2018-06-02 DIAGNOSIS — D485 Neoplasm of uncertain behavior of skin: Secondary | ICD-10-CM | POA: Diagnosis not present

## 2018-06-22 ENCOUNTER — Encounter: Payer: Self-pay | Admitting: Family Medicine

## 2018-06-22 ENCOUNTER — Ambulatory Visit (INDEPENDENT_AMBULATORY_CARE_PROVIDER_SITE_OTHER): Payer: BLUE CROSS/BLUE SHIELD | Admitting: Family Medicine

## 2018-06-22 VITALS — BP 98/70 | HR 88 | Temp 99.3°F | Wt 151.0 lb

## 2018-06-22 DIAGNOSIS — R1011 Right upper quadrant pain: Secondary | ICD-10-CM | POA: Diagnosis not present

## 2018-06-22 DIAGNOSIS — R110 Nausea: Secondary | ICD-10-CM | POA: Diagnosis not present

## 2018-06-22 DIAGNOSIS — F3281 Premenstrual dysphoric disorder: Secondary | ICD-10-CM

## 2018-06-22 LAB — POCT URINALYSIS DIPSTICK
Bilirubin, UA: NEGATIVE
Glucose, UA: NEGATIVE
KETONES UA: NEGATIVE
LEUKOCYTES UA: NEGATIVE
Nitrite, UA: NEGATIVE
PROTEIN UA: NEGATIVE
SPEC GRAV UA: 1.02 (ref 1.010–1.025)
Urobilinogen, UA: 0.2 E.U./dL
pH, UA: 6 (ref 5.0–8.0)

## 2018-06-22 LAB — POCT URINE PREGNANCY: Preg Test, Ur: NEGATIVE

## 2018-06-22 MED ORDER — ONDANSETRON HCL 4 MG PO TABS: 4.0000 mg | ORAL_TABLET | Freq: Three times a day (TID) | ORAL | 0 refills | Status: DC | PRN

## 2018-06-22 NOTE — Progress Notes (Signed)
Subjective:    Patient ID: Kim Lloyd, female    DOB: 1991/02/15, 27 y.o.   MRN: [id number]  No chief complaint on file.   HPI Patient was seen today for acute concern.  Nausea: -X1 week -Notes after eating anything except water -Patient has been unable to eat 2/2 the nausea and gagging feeling. -Denies emesis -Denies changes in medications -Sexually active, uses condoms on Camrese OCPs -LMP was last wk. -Took a pregnancy test last week which was negative -h/o elevated bilirubin and jaundice.  At the time unable to f/u with GI 2/2 lack of insurance.  PMDD: -pt notes feeling better on Camrese OCPs -less depressed in between menses -Noted increased depression during menses.  States it was "horrible" -Interested in IUD -Taking Lamictal 25 mg  Past Medical History:  Diagnosis Date  . Allergy   . Anxiety   . Bipolar 1 disorder (HCC)   . Depression   . Eating disorder    remission  . Gall bladder disease    gallbladder sluge which led to jaundice  . GERD (gastroesophageal reflux disease)   . History of chicken pox    age 88  . Migraine headache without aura   . Overdose drug     Allergies  Allergen Reactions  . Coconut Oil     ROS General: Denies fever, chills, night sweats, changes in weight, changes in appetite HEENT: Denies headaches, ear pain, changes in vision, rhinorrhea, sore throat CV: Denies CP, palpitations, SOB, orthopnea Pulm: Denies SOB, cough, wheezing GI: Denies abdominal pain, nausea, vomiting, diarrhea, constipation GU: Denies dysuria, hematuria, frequency, vaginal discharge Msk: Denies muscle cramps, joint pains Neuro: Denies weakness, numbness, tingling Skin: Denies rashes, bruising Psych: Denies depression, anxiety, hallucinations      Objective:    Blood pressure 98/70, pulse 88, temperature 99.3 F (37.4 C), temperature source Oral, weight 151 lb (68.5 kg), SpO2 97 %.   Gen. Pleasant, well-nourished, in no distress, normal  affect   HEENT: [place]/AT, face symmetric, no scleral icterus, PERRLA, nares patent without drainage Lungs: no accessory muscle use, CTAB, no wheezes or rales Cardiovascular: RRR, no m/r/g, no peripheral edema Abdomen: BS present, soft, ND, TTP in RUQ.  no hepatosplenomegaly. Neuro:  A&Ox3, CN II-XII intact, normal gait   Wt Readings from Last 3 Encounters:  06/22/18 151 lb (68.5 kg)  02/02/18 149 lb (67.6 kg)  11/17/17 147 lb (66.7 kg)    Lab Results  Component Value Date   WBC 5.1 11/17/2017   HGB 12.6 11/17/2017   HCT 36.5 11/17/2017   PLT 267.0 11/17/2017   GLUCOSE 84 11/17/2017   CHOL 123 11/17/2017   TRIG 34.0 11/17/2017   HDL 54.10 11/17/2017   LDLCALC 62 11/17/2017   ALT 10 11/17/2017   AST 9 11/17/2017   NA 136 11/17/2017   K 3.9 11/17/2017   CL 102 11/17/2017   CREATININE 0.75 11/17/2017   BUN 11 11/17/2017   CO2 28 11/17/2017   TSH 1.92 11/11/2016   INR 1.0 09/17/2012    Assessment/Plan:  Nausea  -UA with 1+ blood, SG 1.020, brown in color, no leuks -Urine hCG negative -pt encouraged to increase po intake of fluids -given handout.   -rx for zofran - Plan: POCT urinalysis dipstick, POCT urine pregnancy, Comprehensive metabolic panel, Lipase, US Abdomen Limited RUQ, ondansetron (ZOFRAN) 4 MG tablet  Right upper quadrant abdominal pain  -discussed possible causes including cholecystitis, biliary colic, gastric ulcer -We will obtain labs and imaging - Plan: Comprehensive  metabolic panel, Lipase, CBC with Differential/Platelet, US Abdomen Limited RUQ -Given RTC or ED precautions for continued or worsening symptoms  PMDD (premenstrual dysphoric disorder)  -continue Camrese -will refer to Gyn for IUD consultation - Plan: Ambulatory referral to Obstetrics / Gynecology  Follow-up PRN  Abbe AmsterdamShannon Shavell Nored, MD

## 2018-06-22 NOTE — Patient Instructions (Signed)
Abdominal Pain, Adult Abdominal pain can be caused by many things. Often, abdominal pain is not serious and it gets better with no treatment or by being treated at home. However, sometimes abdominal pain is serious. Your health care provider will do a medical history and a physical exam to try to determine the cause of your abdominal pain. Follow these instructions at home:  Take over-the-counter and prescription medicines only as told by your health care provider. Do not take a laxative unless told by your health care provider.  Drink enough fluid to keep your urine clear or pale yellow.  Watch your condition for any changes.  Keep all follow-up visits as told by your health care provider. This is important. Contact a health care provider if:  Your abdominal pain changes or gets worse.  You are not hungry or you lose weight without trying.  You are constipated or have diarrhea for more than 2-3 days.  You have pain when you urinate or have a bowel movement.  Your abdominal pain wakes you up at night.  Your pain gets worse with meals, after eating, or with certain foods.  You are throwing up and cannot keep anything down.  You have a fever. Get help right away if:  Your pain does not go away as soon as your health care provider told you to expect.  You cannot stop throwing up.  Your pain is only in areas of the abdomen, such as the right side or the left lower portion of the abdomen.  You have bloody or black stools, or stools that look like tar.  You have severe pain, cramping, or bloating in your abdomen.  You have signs of dehydration, such as: ? Dark urine, very little urine, or no urine. ? Cracked lips. ? Dry mouth. ? Sunken eyes. ? Sleepiness. ? Weakness. This information is not intended to replace advice given to you by your health care provider. Make sure you discuss any questions you have with your health care provider. Document Released: 04/15/2005 Document  Revised: 01/24/2016 Document Reviewed: 12/18/2015 Elsevier Interactive Patient Education  2018 [employer]  Nausea, Adult Nausea is the feeling of an upset stomach or having to vomit. Nausea on its own is not usually a serious concern, but it may be an early sign of a more serious medical problem. As nausea gets worse, it can lead to vomiting. If vomiting develops, or if you are not able to drink enough fluids, you are at risk of becoming dehydrated. Dehydration can make you tired and thirsty, cause you to have a dry mouth, and decrease how often you urinate. Older adults and people with other diseases or a weak immune system are at higher risk for dehydration. The main goals of treating your nausea are:  To limit repeated nausea episodes.  To prevent vomiting and dehydration.  Follow these instructions at home: Follow instructions from your health care provider about how to care for yourself at home. Eating and drinking Follow these recommendations as told by your health care provider:  Take an oral rehydration solution (ORS). This is a drink that is sold at pharmacies and retail stores.  Drink clear fluids in small amounts as you are able. Clear fluids include water, ice chips, diluted fruit juice, and low-calorie sports drinks.  Eat bland, easy-to-digest foods in small amounts as you are able. These foods include bananas, applesauce, rice, lean meats, toast, and crackers.  Avoid drinking fluids that contain a lot of sugar or caffeine,  such as energy drinks, sports drinks, and soda.  Avoid alcohol.  Avoid spicy or fatty foods.  General instructions  Drink enough fluid to keep your urine clear or pale yellow.  Wash your hands often. If soap and water are not available, use hand sanitizer.  Make sure that all people in your household wash their hands well and often.  Rest at home while you recover.  Take over-the-counter and prescription medicines only as told by your health  care provider.  Breathe slowly and deeply when you feel nauseous.  Watch your condition for any changes.  Keep all follow-up visits as told by your health care provider. This is important. Contact a health care provider if:  You have a headache.  You have new symptoms.  Your nausea gets worse.  You have a fever.  You feel light-headed or dizzy.  You vomit.  You cannot keep fluids down. Get help right away if:  You have pain in your chest, neck, arm, or jaw.  You feel extremely weak or you faint.  You have vomit that is bright red or looks like coffee grounds.  You have bloody or black stools or stools that look like tar.  You have a severe headache, a stiff neck, or both.  You have severe pain, cramping, or bloating in your abdomen.  You have a rash.  You have difficulty breathing or are breathing very quickly.  Your heart is beating very quickly.  Your skin feels cold and clammy.  You feel confused.  You have pain when you urinate.  You have signs of dehydration, such as: ? Dark urine, very little, or no urine. ? Cracked lips. ? Dry mouth. ? Sunken eyes. ? Sleepiness. ? Weakness. These symptoms may represent a serious problem that is an emergency. Do not wait to see if the symptoms will go away. Get medical help right away. Call your local emergency services (911 in the [place] Do not drive yourself to the hospital. This information is not intended to replace advice given to you by your health care provider. Make sure you discuss any questions you have with your health care provider. Document Released: 08/13/2004 Document Revised: 12/09/2015 Document Reviewed: 03/12/2015 Elsevier Interactive Patient Education  [employer].

## 2018-06-23 ENCOUNTER — Ambulatory Visit
Admission: RE | Admit: 2018-06-23 | Discharge: 2018-06-23 | Disposition: A | Discharge status: Home / Self Care | Payer: BLUE CROSS/BLUE SHIELD | Source: Ambulatory Visit | Attending: Family Medicine | Admitting: Family Medicine

## 2018-06-23 DIAGNOSIS — R1011 Right upper quadrant pain: Secondary | ICD-10-CM

## 2018-06-23 DIAGNOSIS — R110 Nausea: Secondary | ICD-10-CM

## 2018-06-23 LAB — COMPREHENSIVE METABOLIC PANEL
ALT: 10 U/L (ref 0–35)
AST: 9 U/L (ref 0–37)
Albumin: 4.5 g/dL (ref 3.5–5.2)
Alkaline Phosphatase: 43 U/L (ref 39–117)
BILIRUBIN TOTAL: 1.2 mg/dL (ref 0.2–1.2)
BUN: 12 mg/dL (ref 6–23)
CO2: 28 meq/L (ref 19–32)
Calcium: 9.4 mg/dL (ref 8.4–10.5)
Chloride: 105 mEq/L (ref 96–112)
Creatinine, Ser: 0.9 mg/dL (ref 0.40–1.20)
GFR: 80.4 mL/min (ref >60.00)
Glucose, Bld: 92 mg/dL (ref 70–99)
Potassium: 4.1 mEq/L (ref 3.5–5.1)
Sodium: 139 mEq/L (ref 135–145)
Total Protein: 7 g/dL (ref 6.0–8.3)

## 2018-06-23 LAB — CBC WITH DIFFERENTIAL/PLATELET
BASOS PCT: 0.7 % (ref 0.0–3.0)
Basophils Absolute: 0 10*3/uL (ref 0.0–0.1)
Eosinophils Absolute: 0 10*3/uL (ref 0.0–0.7)
Eosinophils Relative: 0.5 % (ref 0.0–5.0)
HCT: 37.6 % (ref 36.0–46.0)
Hemoglobin: 13.1 g/dL (ref 12.0–15.0)
Lymphocytes Relative: 41.6 % (ref 12.0–46.0)
Lymphs Abs: 2.5 10*3/uL (ref 0.7–4.0)
MCHC: 34.8 g/dL (ref 30.0–36.0)
MCV: 86.5 fl (ref 78.0–100.0)
Monocytes Absolute: 0.3 10*3/uL (ref 0.1–1.0)
Monocytes Relative: 5.1 % (ref 3.0–12.0)
Neutro Abs: 3.1 10*3/uL (ref 1.4–7.7)
Neutrophils Relative %: 52.1 % (ref 43.0–77.0)
Platelets: 285 10*3/uL (ref 150.0–400.0)
RBC: 4.34 Mil/uL (ref 3.87–5.11)
RDW: 13 % (ref 11.5–15.5)
WBC: 6 10*3/uL (ref 4.0–10.5)

## 2018-06-23 LAB — LIPASE: Lipase: 12 U/L (ref 11.0–59.0)

## 2018-07-11 DIAGNOSIS — F419 Anxiety disorder, unspecified: Secondary | ICD-10-CM | POA: Diagnosis not present

## 2018-07-11 DIAGNOSIS — F330 Major depressive disorder, recurrent, mild: Secondary | ICD-10-CM | POA: Diagnosis not present

## 2018-07-18 DIAGNOSIS — F4310 Post-traumatic stress disorder, unspecified: Secondary | ICD-10-CM | POA: Diagnosis not present

## 2018-07-27 ENCOUNTER — Other Ambulatory Visit: Payer: Self-pay | Admitting: Family Medicine

## 2018-07-27 DIAGNOSIS — K219 Gastro-esophageal reflux disease without esophagitis: Secondary | ICD-10-CM

## 2018-08-09 DIAGNOSIS — F4310 Post-traumatic stress disorder, unspecified: Secondary | ICD-10-CM | POA: Diagnosis not present

## 2018-08-15 DIAGNOSIS — F4310 Post-traumatic stress disorder, unspecified: Secondary | ICD-10-CM | POA: Diagnosis not present

## 2018-10-25 ENCOUNTER — Other Ambulatory Visit: Payer: Self-pay | Admitting: Family Medicine

## 2018-10-25 DIAGNOSIS — K219 Gastro-esophageal reflux disease without esophagitis: Secondary | ICD-10-CM

## 2018-11-03 DIAGNOSIS — F419 Anxiety disorder, unspecified: Secondary | ICD-10-CM | POA: Diagnosis not present

## 2018-11-03 DIAGNOSIS — F330 Major depressive disorder, recurrent, mild: Secondary | ICD-10-CM | POA: Diagnosis not present

## 2018-11-25 ENCOUNTER — Other Ambulatory Visit: Payer: Self-pay

## 2018-11-25 ENCOUNTER — Encounter: Payer: BLUE CROSS/BLUE SHIELD | Admitting: Family Medicine

## 2018-11-25 ENCOUNTER — Encounter: Payer: Self-pay | Admitting: Family Medicine

## 2018-11-25 ENCOUNTER — Ambulatory Visit (INDEPENDENT_AMBULATORY_CARE_PROVIDER_SITE_OTHER): Payer: BLUE CROSS/BLUE SHIELD | Admitting: Family Medicine

## 2018-11-25 DIAGNOSIS — N3000 Acute cystitis without hematuria: Secondary | ICD-10-CM

## 2018-11-25 DIAGNOSIS — R300 Dysuria: Secondary | ICD-10-CM

## 2018-11-25 LAB — POC URINALSYSI DIPSTICK (AUTOMATED)
Blood, UA: NEGATIVE
Glucose, UA: NEGATIVE
Ketones, UA: NEGATIVE
Nitrite, UA: POSITIVE
Protein, UA: POSITIVE — AB
Spec Grav, UA: >=1.03 — AB (ref 1.010–1.025)
Urobilinogen, UA: 2 E.U./dL — AB
pH, UA: 5 (ref 5.0–8.0)

## 2018-11-25 MED ORDER — AMOXICILLIN 500 MG PO CAPS: 500.0000 mg | ORAL_CAPSULE | Freq: Two times a day (BID) | ORAL | 0 refills | Status: AC

## 2018-11-25 NOTE — Progress Notes (Signed)
Pt did not join webex, nor via doxy for appointment.  Pt called with no answer.

## 2018-11-25 NOTE — Progress Notes (Signed)
Virtual Visit via Video Note  I connected with Kim Lloyd on 11/25/18 at  1:30 PM EDT by a video enabled telemedicine application and verified that I am speaking with the correct person using two identifiers.  Location patient: home Location provider:work or home office Persons participating in the virtual visit: patient, provider  I discussed the limitations of evaluation and management by telemedicine and the availability of in person appointments. The patient expressed understanding and agreed to proceed.   HPI: Pt with dysuria, odor, increased bladder pressure, and frequency x a few days.  Took Azo with no change in symptoms.  Denies back pain, hematuria, fever, n/v.  Drinking more water and cranberry juice.  Feels like when had UTI in the past.  Pt provided Urine sample at clinic.  ROS: See pertinent positives and negatives per HPI.  Past Medical History:  Diagnosis Date  . Allergy   . Anxiety   . Bipolar 1 disorder (HCC)   . Depression   . Eating disorder    remission  . Gall bladder disease    gallbladder sluge which led to jaundice  . GERD (gastroesophageal reflux disease)   . History of chicken pox    age 28  . Migraine headache without aura   . Overdose drug     No past surgical history on file.  Family History  Problem Relation Age of Onset  . Mental illness Mother   . Alcohol abuse Mother   . Mental illness Father   . Mental illness Brother   . Alcohol abuse Maternal Aunt   . Drug abuse Maternal Aunt   . Hypothyroidism Maternal Grandmother   . Coronary artery disease Maternal Grandfather     SOCIAL HX:    Current Outpatient Medications:  .  famotidine (PEPCID) 20 MG tablet, Take 2 tablets (40 mg total) by mouth at bedtime., Disp: 60 tablet, Rfl: 3 .  gabapentin (NEURONTIN) 300 MG capsule, Take 300 mg by mouth 3 (three) times daily., Disp: , Rfl: 0 .  lamoTRIgine (LAMICTAL) 25 MG tablet, Take 25 mg by mouth daily., Disp: , Rfl:  .   Levonorgestrel-Ethinyl Estradiol (AMETHIA,CAMRESE) 0.15-0.03 &0.01 MG tablet, Take 1 tablet by mouth daily., Disp: 1 Package, Rfl: 4 .  Norgestimate-Ethinyl Estradiol Triphasic (TRI-LO-SPRINTEC) 0.18/0.215/0.25 MG-25 MCG tab, Take 1 tablet by mouth daily., Disp: 3 Package, Rfl: 3 .  ondansetron (ZOFRAN) 4 MG tablet, Take 1 tablet (4 mg total) by mouth every 8 (eight) hours as needed for nausea or vomiting., Disp: 20 tablet, Rfl: 0 .  pantoprazole (PROTONIX) 40 MG tablet, TAKE ONE TABLET BY MOUTH EVERY MORNING BEFORE BREAKFAST, Disp: 90 tablet, Rfl: 0  EXAM:  VITALS per patient if applicable: RR between 12-20 bpm  GENERAL: alert, oriented, appears well and in no acute distress  HEENT: atraumatic, conjunctiva clear, no obvious abnormalities on inspection of external nose and ears  NECK: normal movements of the head and neck  LUNGS: on inspection no signs of respiratory distress, breathing rate appears normal, no obvious gross SOB, gasping or wheezing  CV: no obvious cyanosis  MS: moves all visible extremities without noticeable abnormality  PSYCH/NEURO: pleasant and cooperative, no obvious depression or anxiety, speech and thought processing grossly intact  ASSESSMENT AND PLAN:  Discussed the following assessment and plan:  Acute cystitis without hematuria  -UA with 2+ leuks, 2+ bilirubin, cloudy, +nitrites, +protein, SG> 1.030, urobilinogen 2.0. -will send for UCx.  Will adjust abx if needed. -increased po hydration encouraged. - Plan: amoxicillin (AMOXIL)  500 MG capsule -given precautions  Dysuria  - Plan: POCT Urinalysis Dipstick (Automated), Urine Culture  F/u prn   I discussed the assessment and treatment plan with the patient. The patient was provided an opportunity to ask questions and all were answered. The patient agreed with the plan and demonstrated an understanding of the instructions.   The patient was advised to call back or seek an in-person evaluation if the  symptoms worsen or if the condition fails to improve as anticipated.  Deeann Saint, MD

## 2018-11-27 LAB — URINE CULTURE
MICRO NUMBER:: 458819
SPECIMEN QUALITY:: ADEQUATE

## 2018-11-29 ENCOUNTER — Telehealth: Payer: Self-pay | Admitting: Family Medicine

## 2018-11-29 NOTE — Telephone Encounter (Signed)
Pt. Given results, verbalizes understanding. 

## 2019-01-26 ENCOUNTER — Other Ambulatory Visit: Payer: Self-pay | Admitting: Family Medicine

## 2019-01-26 DIAGNOSIS — K219 Gastro-esophageal reflux disease without esophagitis: Secondary | ICD-10-CM

## 2019-01-26 NOTE — Telephone Encounter (Signed)
Patient needs a refill on her birth control    Sent to:  [facility] at [place], [place] [phone] (Phone) [phone] (Fax)

## 2019-02-02 ENCOUNTER — Telehealth: Payer: Self-pay | Admitting: Family Medicine

## 2019-02-02 NOTE — Telephone Encounter (Signed)
Relation to pt: self  Call back number:[phone]    Reason for call:  Patient requesting a list of vaccinations / immunization records for school and would like to pick up when ready, please advise

## 2019-02-08 ENCOUNTER — Encounter: Payer: Self-pay | Admitting: Family Medicine

## 2019-02-08 ENCOUNTER — Ambulatory Visit (INDEPENDENT_AMBULATORY_CARE_PROVIDER_SITE_OTHER): Payer: BC Managed Care – PPO | Admitting: Family Medicine

## 2019-02-08 ENCOUNTER — Other Ambulatory Visit: Payer: Self-pay

## 2019-02-08 VITALS — BP 98/68 | HR 76 | Temp 98.4°F | Ht 66.5 in | Wt 153.8 lb

## 2019-02-08 DIAGNOSIS — Z0000 Encounter for general adult medical examination without abnormal findings: Secondary | ICD-10-CM | POA: Diagnosis not present

## 2019-02-08 DIAGNOSIS — R1032 Left lower quadrant pain: Secondary | ICD-10-CM | POA: Diagnosis not present

## 2019-02-08 LAB — CBC
HCT: 36.7 % (ref 36.0–46.0)
Hemoglobin: 12.4 g/dL (ref 12.0–15.0)
MCHC: 33.7 g/dL (ref 30.0–36.0)
MCV: 86.5 fl (ref 78.0–100.0)
Platelets: 268 10*3/uL (ref 150.0–400.0)
RBC: 4.24 Mil/uL (ref 3.87–5.11)
RDW: 13 % (ref 11.5–15.5)
WBC: 4.6 10*3/uL (ref 4.0–10.5)

## 2019-02-08 LAB — COMPREHENSIVE METABOLIC PANEL
ALT: 12 U/L (ref 0–35)
AST: 12 U/L (ref 0–37)
Albumin: 4.4 g/dL (ref 3.5–5.2)
Alkaline Phosphatase: 48 U/L (ref 39–117)
BUN: 15 mg/dL (ref 6–23)
CO2: 27 mEq/L (ref 19–32)
Calcium: 9.3 mg/dL (ref 8.4–10.5)
Chloride: 104 mEq/L (ref 96–112)
Creatinine, Ser: 0.8 mg/dL (ref 0.40–1.20)
GFR: 86.24 mL/min (ref >60.00)
Glucose, Bld: 94 mg/dL (ref 70–99)
Potassium: 4 mEq/L (ref 3.5–5.1)
Sodium: 136 mEq/L (ref 135–145)
Total Bilirubin: 0.7 mg/dL (ref 0.2–1.2)
Total Protein: 6.8 g/dL (ref 6.0–8.3)

## 2019-02-08 LAB — POCT URINALYSIS DIPSTICK
Bilirubin, UA: NEGATIVE
Glucose, UA: NEGATIVE
Ketones, UA: NEGATIVE
Leukocytes, UA: NEGATIVE
Nitrite, UA: NEGATIVE
Odor: NEGATIVE
Protein, UA: NEGATIVE
Spec Grav, UA: >=1.03 — AB (ref 1.010–1.025)
Urobilinogen, UA: 0.2 E.U./dL
pH, UA: 6 (ref 5.0–8.0)

## 2019-02-08 NOTE — Patient Instructions (Signed)
Preventive Care 21-27 Years Old, Female Preventive care refers to visits with your health care provider and lifestyle choices that can promote health and wellness. This includes:  A yearly physical exam. This may also be called an annual well check.  Regular dental visits and eye exams.  Immunizations.  Screening for certain conditions.  Healthy lifestyle choices, such as eating a healthy diet, getting regular exercise, not using drugs or products that contain nicotine and tobacco, and limiting alcohol use. What can I expect for my preventive care visit? Physical exam Your health care provider will check your:  Height and weight. This may be used to calculate body mass index (BMI), which tells if you are at a healthy weight.  Heart rate and blood pressure.  Skin for abnormal spots. Counseling Your health care provider may ask you questions about your:  Alcohol, tobacco, and drug use.  Emotional well-being.  Home and relationship well-being.  Sexual activity.  Eating habits.  Work and work environment.  Method of birth control.  Menstrual cycle.  Pregnancy history. What immunizations do I need?  Influenza (flu) vaccine  This is recommended every year. Tetanus, diphtheria, and pertussis (Tdap) vaccine  You may need a Td booster every 10 years. Varicella (chickenpox) vaccine  You may need this if you have not been vaccinated. Human papillomavirus (HPV) vaccine  If recommended by your health care provider, you may need three doses over 6 months. Measles, mumps, and rubella (MMR) vaccine  You may need at least one dose of MMR. You may also need a second dose. Meningococcal conjugate (MenACWY) vaccine  One dose is recommended if you are age 19-21 years and a first-year college student living in a residence hall, or if you have one of several medical conditions. You may also need additional booster doses. Pneumococcal conjugate (PCV13) vaccine  You may need  this if you have certain conditions and were not previously vaccinated. Pneumococcal polysaccharide (PPSV23) vaccine  You may need one or two doses if you smoke cigarettes or if you have certain conditions. Hepatitis A vaccine  You may need this if you have certain conditions or if you travel or work in places where you may be exposed to hepatitis A. Hepatitis B vaccine  You may need this if you have certain conditions or if you travel or work in places where you may be exposed to hepatitis B. Haemophilus influenzae type b (Hib) vaccine  You may need this if you have certain conditions. You may receive vaccines as individual doses or as more than one vaccine together in one shot (combination vaccines). Talk with your health care provider about the risks and benefits of combination vaccines. What tests do I need?  Blood tests  Lipid and cholesterol levels. These may be checked every 5 years starting at age 20.  Hepatitis C test.  Hepatitis B test. Screening  Diabetes screening. This is done by checking your blood sugar (glucose) after you have not eaten for a while (fasting).  Sexually transmitted disease (STD) testing.  BRCA-related cancer screening. This may be done if you have a family history of breast, ovarian, tubal, or peritoneal cancers.  Pelvic exam and Pap test. This may be done every 3 years starting at age 21. Starting at age 30, this may be done every 5 years if you have a Pap test in combination with an HPV test. Talk with your health care provider about your test results, treatment options, and if necessary, the need for more tests.   Follow these instructions at home: Eating and drinking   Eat a diet that includes fresh fruits and vegetables, whole grains, lean protein, and low-fat dairy.  Take vitamin and mineral supplements as recommended by your health care provider.  Do not drink alcohol if: ? Your health care provider tells you not to drink. ? You are  pregnant, may be pregnant, or are planning to become pregnant.  If you drink alcohol: ? Limit how much you have to 0-1 drink a day. ? Be aware of how much alcohol is in your drink. In the [place] one drink equals one 12 oz bottle of beer (355 mL), one 5 oz glass of wine (148 mL), or one 1 oz glass of hard liquor (44 mL). Lifestyle  Take daily care of your teeth and gums.  Stay active. Exercise for at least 30 minutes on 5 or more days each week.  Do not use any products that contain nicotine or tobacco, such as cigarettes, e-cigarettes, and chewing tobacco. If you need help quitting, ask your health care provider.  If you are sexually active, practice safe sex. Use a condom or other form of birth control (contraception) in order to prevent pregnancy and STIs (sexually transmitted infections). If you plan to become pregnant, see your health care provider for a preconception visit. What's next?  Visit your health care provider once a year for a well check visit.  Ask your health care provider how often you should have your eyes and teeth checked.  Stay up to date on all vaccines. This information is not intended to replace advice given to you by your health care provider. Make sure you discuss any questions you have with your health care provider. Document Released: 09/01/2001 Document Revised: 03/17/2018 Document Reviewed: 03/17/2018 Elsevier Patient Education  2020 [employer].  Abdominal Pain, Adult Abdominal pain can be caused by many things. Often, abdominal pain is not serious and it gets better with no treatment or by being treated at home. However, sometimes abdominal pain is serious. Your health care provider will do a medical history and a physical exam to try to determine the cause of your abdominal pain. Follow these instructions at home:  Take over-the-counter and prescription medicines only as told by your health care provider. Do not take a laxative unless told by your  health care provider.  Drink enough fluid to keep your urine clear or pale yellow.  Watch your condition for any changes.  Keep all follow-up visits as told by your health care provider. This is important. Contact a health care provider if:  Your abdominal pain changes or gets worse.  You are not hungry or you lose weight without trying.  You are constipated or have diarrhea for more than 2-3 days.  You have pain when you urinate or have a bowel movement.  Your abdominal pain wakes you up at night.  Your pain gets worse with meals, after eating, or with certain foods.  You are throwing up and cannot keep anything down.  You have a fever. Get help right away if:  Your pain does not go away as soon as your health care provider told you to expect.  You cannot stop throwing up.  Your pain is only in areas of the abdomen, such as the right side or the left lower portion of the abdomen.  You have bloody or black stools, or stools that look like tar.  You have severe pain, cramping, or bloating in your abdomen.  You have signs of dehydration, such as: ? Dark urine, very little urine, or no urine. ? Cracked lips. ? Dry mouth. ? Sunken eyes. ? Sleepiness. ? Weakness. This information is not intended to replace advice given to you by your health care provider. Make sure you discuss any questions you have with your health care provider. Document Released: 04/15/2005 Document Revised: 01/24/2016 Document Reviewed: 12/18/2015 Elsevier Interactive Patient Education  [employer].

## 2019-02-08 NOTE — Progress Notes (Signed)
Subjective:     Kim Lloyd is a 28 y.o. female and is here for a comprehensive physical exam. The patient reports no problems.  States mood has been good.  Following with Psych.  Pt plans to move to [facility] with her bf.  Pt states she will need proof of vaccines for school.  Pt had varicella and tdap in 2017.  Social History   Socioeconomic History  . Marital status: Single    Spouse name: Not on file  . Number of children: Not on file  . Years of education: Not on file  . Highest education level: Not on file  Occupational History  . Not on file  Social Needs  . Financial resource strain: Not on file  . Food insecurity    Worry: Not on file    Inability: Not on file  . Transportation needs    Medical: Not on file    Non-medical: Not on file  Tobacco Use  . Smoking status: Never Smoker  . Smokeless tobacco: Never Used  Substance and Sexual Activity  . Alcohol use: Yes    Alcohol/week: 2.0 standard drinks    Types: 2 Cans of beer per week  . Drug use: No  . Sexual activity: Yes    Birth control/protection: Pill  Lifestyle  . Physical activity    Days per week: Not on file    Minutes per session: Not on file  . Stress: Not on file  Relationships  . Social Herbalist on phone: Not on file    Gets together: Not on file    Attends religious service: Not on file    Active member of club or organization: Not on file    Attends meetings of clubs or organizations: Not on file    Relationship status: Not on file  . Intimate partner violence    Fear of current or ex partner: Not on file    Emotionally abused: Not on file    Physically abused: Not on file    Forced sexual activity: Not on file  Other Topics Concern  . Not on file  Social History Narrative  . Not on file   Health Maintenance  Topic Date Due  . PAP SMEAR-Modifier  11/18/2018  . INFLUENZA VACCINE  02/18/2019  . PAP-Cervical Cytology Screening  11/17/2020  . TETANUS/TDAP  04/08/2026  . HIV  Screening  Completed    The following portions of the patient's history were reviewed and updated as appropriate: allergies, current medications, past family history, past medical history, past social history, past surgical history and problem list.  Review of Systems Pertinent items noted in HPI and remainder of comprehensive ROS otherwise negative.   Objective:    BP 98/68 (BP Location: Left Arm, Patient Position: Sitting, Cuff Size: Normal)   Pulse 76   Temp 98.4 F (36.9 C) (Oral)   Ht 5' 6.5" (1.689 m)   Wt 153 lb 12.8 oz (69.8 kg)   LMP 02/01/2019 (Exact Date)   SpO2 98%   BMI 24.45 kg/m  General appearance: alert, cooperative, appears stated age and no distress Head: Normocephalic, without obvious abnormality, atraumatic Eyes: conjunctivae/corneas clear. PERRL, EOM's intact. Fundi benign. Ears: normal TM's and external ear canals both ears Nose: Nares normal. Septum midline. Mucosa normal. No drainage or sinus tenderness. Throat: lips, mucosa, and tongue normal; teeth and gums normal Neck: no adenopathy, no carotid bruit, no JVD, supple, symmetrical, trachea midline and thyroid not enlarged, symmetric, no  tenderness/mass/nodules Lungs: clear to auscultation bilaterally Heart: regular rate and rhythm, S1, S2 normal, no murmur, click, rub or gallop Abdomen: Soft, TTP in LLQ, nondistended, BS appreciated, no hepatomegaly, no splenomegaly. Extremities: extremities normal, atraumatic, no cyanosis or edema Pulses: 2+ and symmetric Skin: Skin color, texture, turgor normal. No rashes or lesions Lymph nodes: Cervical, supraclavicular, and axillary nodes normal. Neurologic: Alert and oriented X 3, normal strength and tone. Normal symmetric reflexes. Normal coordination and gait    Assessment:    Healthy female with LLQ pain on exam     Plan:     Anticipatory guidance given including wearing seatbelts, smoke detectors in the home, increasing physical activity, increasing p.o.  intake of water and vegetables. -will obtain labs. -PAP up to date.  Done 11/17/17 -given handout See After Visit Summary for Counseling Recommendations    LLQ pain -noted on exam -will obtain UA and labs  F/u prn  Abbe AmsterdamShannon Alexandar Weisenberger, MD

## 2019-02-10 ENCOUNTER — Encounter: Payer: Self-pay | Admitting: Family Medicine

## 2019-03-13 DIAGNOSIS — F330 Major depressive disorder, recurrent, mild: Secondary | ICD-10-CM | POA: Diagnosis not present

## 2019-03-13 DIAGNOSIS — F419 Anxiety disorder, unspecified: Secondary | ICD-10-CM | POA: Diagnosis not present

## 2019-04-22 ENCOUNTER — Other Ambulatory Visit: Payer: Self-pay | Admitting: Family Medicine

## 2019-04-22 DIAGNOSIS — K219 Gastro-esophageal reflux disease without esophagitis: Secondary | ICD-10-CM

## 2019-04-24 ENCOUNTER — Other Ambulatory Visit: Payer: Self-pay | Admitting: Family Medicine

## 2019-04-24 DIAGNOSIS — F3281 Premenstrual dysphoric disorder: Secondary | ICD-10-CM

## 2019-05-04 ENCOUNTER — Other Ambulatory Visit: Payer: Self-pay | Admitting: Family Medicine

## 2019-05-04 DIAGNOSIS — F3281 Premenstrual dysphoric disorder: Secondary | ICD-10-CM

## 2019-05-04 NOTE — Telephone Encounter (Signed)
Rx was sent to [facility] in [place] 1 week ago, left pt a detailed message

## 2019-05-16 IMAGING — US US ABDOMEN LIMITED
1 series · 14 of 25 positions shown · non-contrast
Comparison: 09/17/2012

CLINICAL DATA: Right upper quadrant abdominal pain for several
weeks.

EXAM:
ULTRASOUND ABDOMEN LIMITED RIGHT UPPER QUADRANT

[Series 1: us abdomen limited · 0.17mm/px · 14 of 50 slices shown]
[im 1/50]
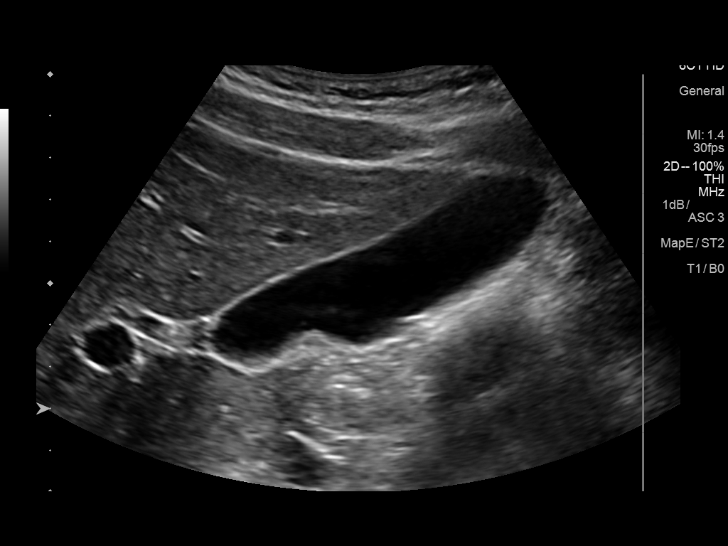
[im 5/50]
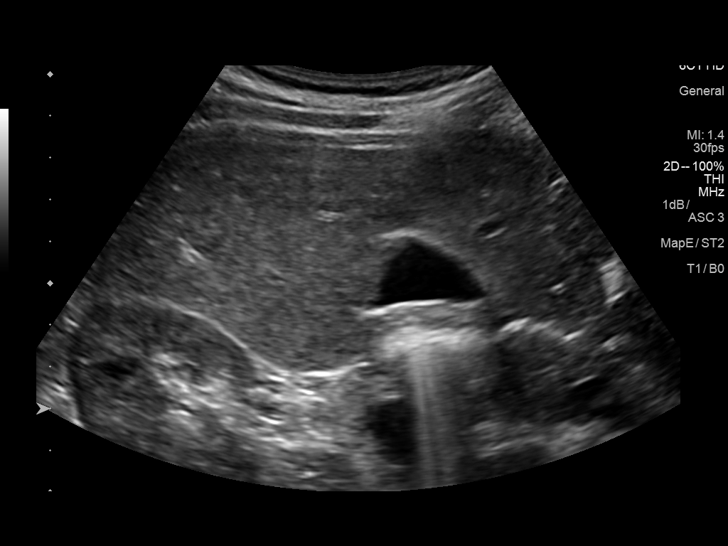
[im 9/50]
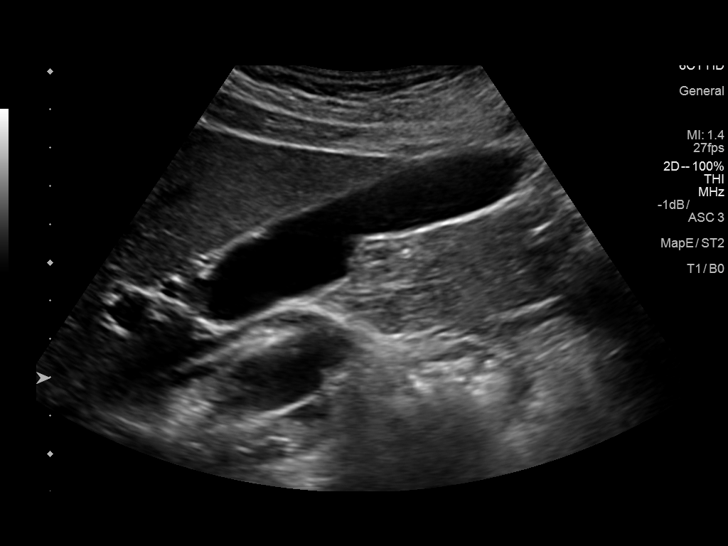
[im 13/50]
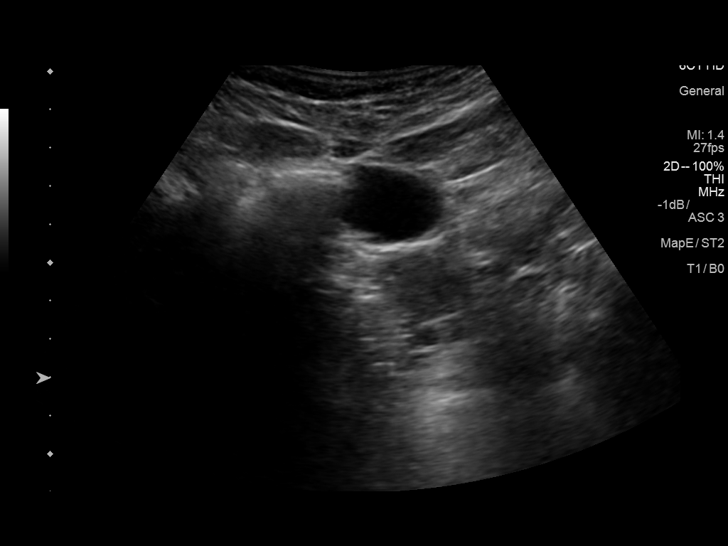
[im 17/50]
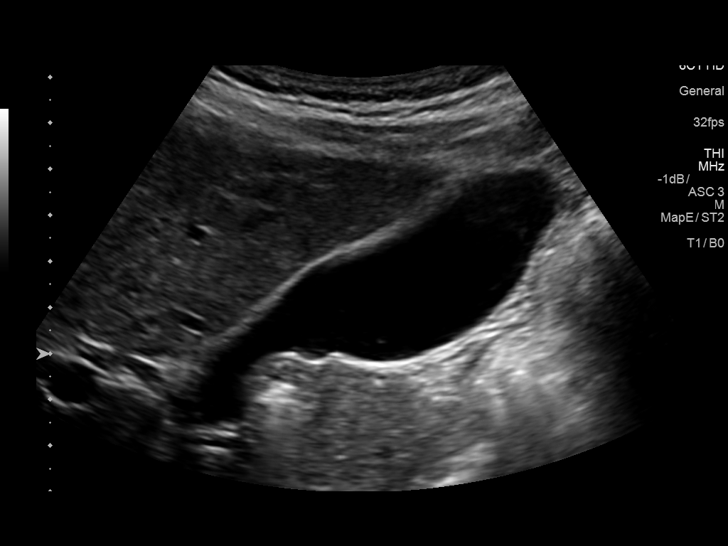
[im 19/50]
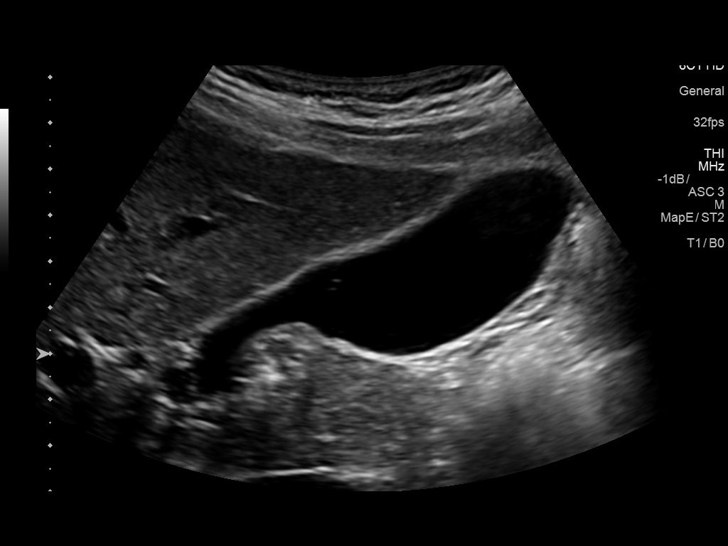
[im 23/50]
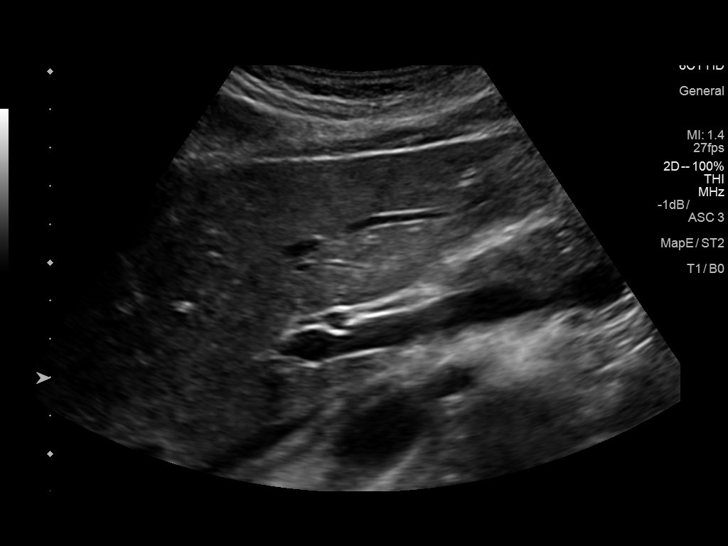
[im 27/50]
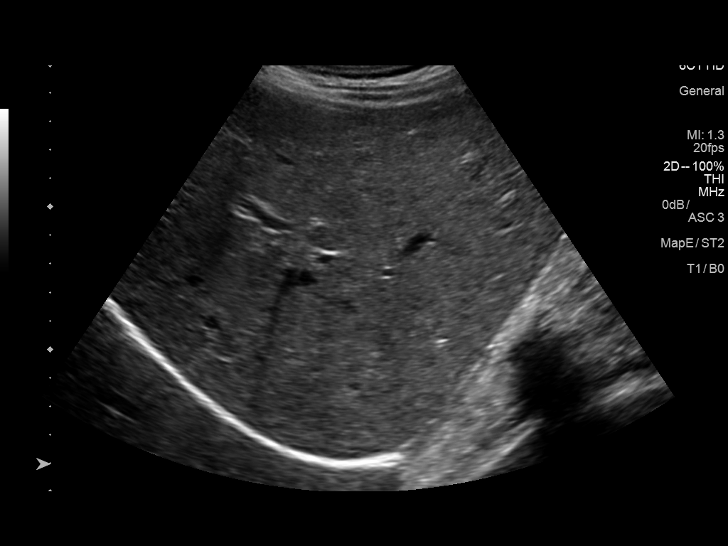
[im 31/50]
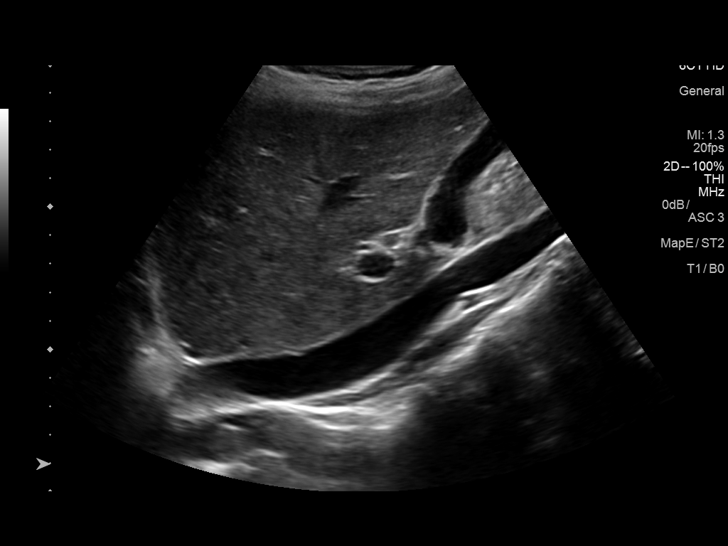
[im 33/50]
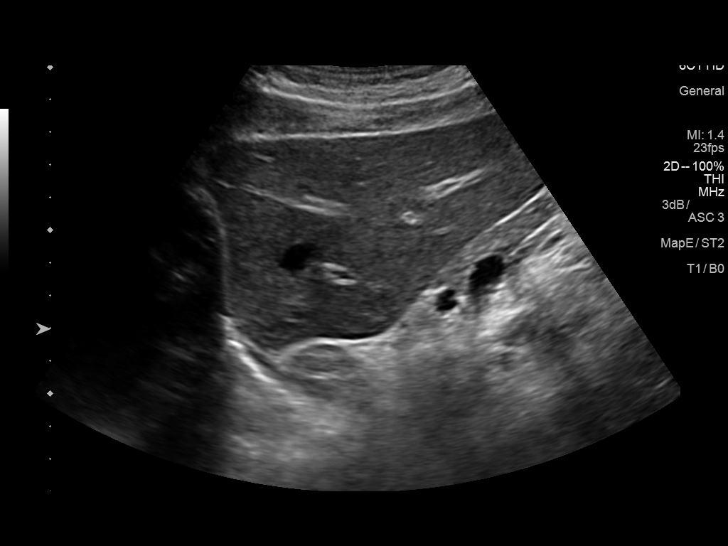
[im 37/50]
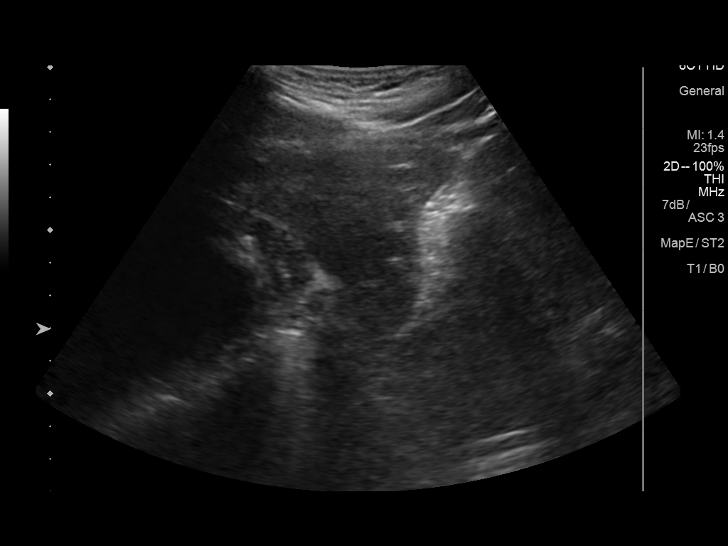
[im 41/50]
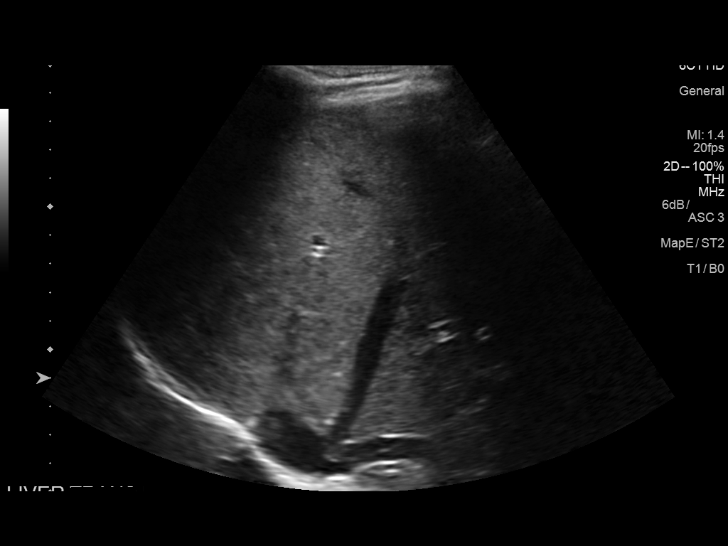
[im 45/50]
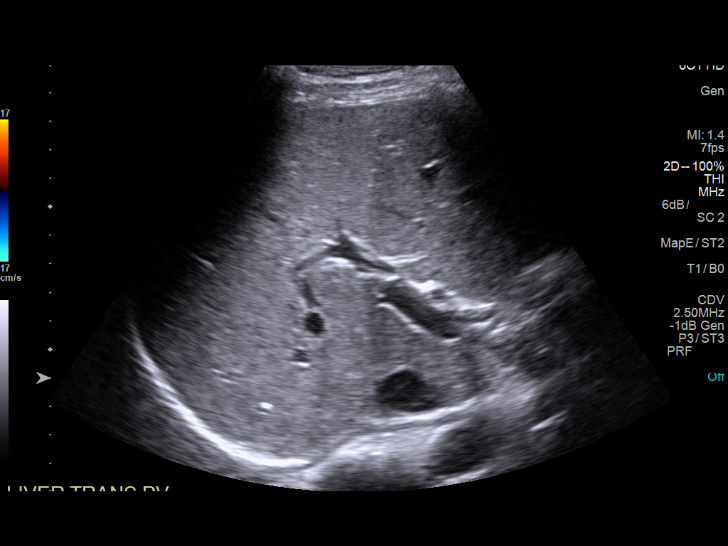
[im 50/50]
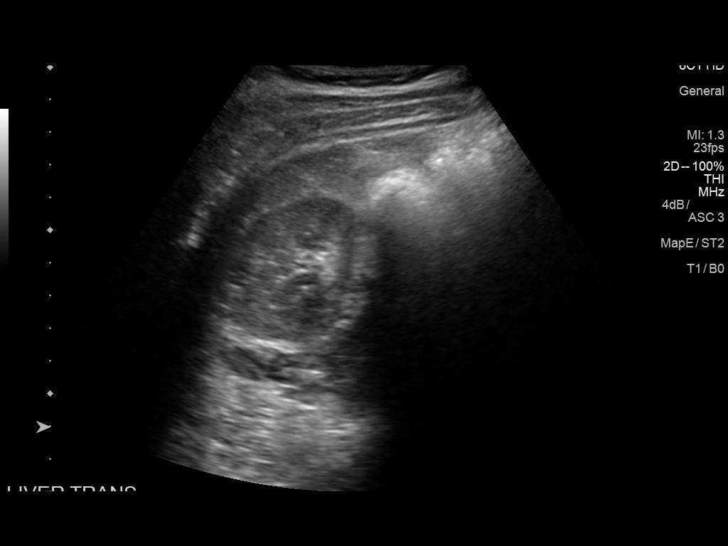

[14 of 25 positions shown; findings below may reference images not displayed]

FINDINGS: Gallbladder:

No gallstones or wall thickening visualized. No sonographic Murphy
sign noted by sonographer.

Common bile duct:

Diameter: 1.7 mm

Liver:

Normal echogenicity without focal lesion or biliary dilatation.
Portal vein is patent on color Doppler imaging with normal direction
of blood flow towards the liver.
IMPRESSION: Normal right upper quadrant ultrasound examination.

## 2019-05-29 ENCOUNTER — Other Ambulatory Visit: Payer: Self-pay

## 2019-05-29 DIAGNOSIS — Z20822 Contact with and (suspected) exposure to covid-19: Secondary | ICD-10-CM

## 2019-05-30 ENCOUNTER — Encounter: Payer: Self-pay | Admitting: Family Medicine

## 2019-05-30 LAB — NOVEL CORONAVIRUS, NAA: SARS-CoV-2, NAA: DETECTED — AB

## 2019-06-02 ENCOUNTER — Other Ambulatory Visit: Payer: Self-pay

## 2019-06-02 ENCOUNTER — Telehealth (INDEPENDENT_AMBULATORY_CARE_PROVIDER_SITE_OTHER): Payer: BC Managed Care – PPO | Admitting: Family Medicine

## 2019-06-02 DIAGNOSIS — U071 COVID-19: Secondary | ICD-10-CM | POA: Diagnosis not present

## 2019-06-02 NOTE — Progress Notes (Signed)
Virtual Visit via Video Note Call started via doxy but audio went out so pt called on the phone, video remained on during call. I connected with Umaima M. Huffstetler on 06/02/19 at  8:30 AM EST by a video enabled telemedicine application 2/2 [id number] pandemic and verified that I am speaking with the correct person using two identifiers.  Location patient: home Location provider:work or home office Persons participating in the virtual visit: patient, provider  I discussed the limitations of evaluation and management by telemedicine and the availability of in person appointments. The patient expressed understanding and agreed to proceed.   HPI:  Pt had a Birthday yesterday!!! Pt is a 28 yo female with pmh sig for h/o PMDD, anxiety/depression, bipolar d/o, migraines, GERD who tested positive for COVID on Monday.  Pt having SOB, cough, back pain that woke her up, chest pain- tight and sharp under diaphragm, nausea, dry heaves, emesis x 1 (after taking OTC med), nasal congestion, loss of taste and smell, rhinorrhea, fever-resolved on Wednesday, decreased appetite.  Pt lightheaded and dizzy when tries to get out of bed.  Having to take really deep breaths just sitting, worse with having to get up.  Chest feels similar to when she had pneumonia but not as bad.  Using a heating pad for her back pain, thinks she may have irritated her skin for overuse.  Pt tried guaifenesin, dextromethorphan, and loratadine.  Trying to stay hydrated but lips dry.    Pt's bf who is in the house with her also tested positive but is getting better.    Pt states she feels like she is always getting sick.  Had swine flu a few yrs ago.  Tries to take vitamins daily.  ROS: See pertinent positives and negatives per HPI.  Past Medical History:  Diagnosis Date  . Allergy   . Anxiety   . Bipolar 1 disorder ([place])   . Depression   . Eating disorder    remission  . Gall bladder disease    gallbladder sluge which led to jaundice   . GERD (gastroesophageal reflux disease)   . History of chicken pox    age 46  . Migraine headache without aura   . Overdose drug     No past surgical history on file.  Family History  Problem Relation Age of Onset  . Mental illness Mother   . Alcohol abuse Mother   . Mental illness Father   . Mental illness Brother   . Alcohol abuse Maternal Aunt   . Drug abuse Maternal Aunt   . Hypothyroidism Maternal Grandmother   . Coronary artery disease Maternal Grandfather      Current Outpatient Medications:  .  DAYSEE 0.15-0.03 &0.01 MG tablet, TAKE ONE TABLET BY MOUTH DAILY, Disp: 91 tablet, Rfl: 3 .  famotidine (PEPCID) 20 MG tablet, Take 2 tablets (40 mg total) by mouth at bedtime., Disp: 60 tablet, Rfl: 3 .  gabapentin (NEURONTIN) 300 MG capsule, Take 300 mg by mouth 3 (three) times daily., Disp: , Rfl: 0 .  lamoTRIgine (LAMICTAL) 25 MG tablet, Take 25 mg by mouth daily., Disp: , Rfl:  .  pantoprazole (PROTONIX) 40 MG tablet, TAKE ONE TABLET BY MOUTH EVERY MORNING BEFORE BREAKFAST, Disp: 90 tablet, Rfl: 0 .  Norgestimate-Ethinyl Estradiol Triphasic (TRI-LO-SPRINTEC) 0.18/0.215/0.25 MG-25 MCG tab, Take 1 tablet by mouth daily., Disp: 3 Package, Rfl: 3 .  ondansetron (ZOFRAN) 4 MG tablet, Take 1 tablet (4 mg total) by mouth every 8 (eight) hours as  needed for nausea or vomiting. (Patient not taking: Reported on 02/08/2019), Disp: 20 tablet, Rfl: 0  EXAM:  VITALS per patient if applicable:   Breathing stable.  GENERAL: alert, oriented, appears fatigued, skin pale.  HEENT: MM dry appearing.  atraumatic, conjunctiva clear, no obvious abnormalities on inspection of external nose and ears  NECK: normal movements of the head and neck  LUNGS: on inspection no signs of respiratory distress, breathing rate appears normal, no tripoding, no obvious gross SOB, gasping or wheezing  CV: no obvious cyanosis  MS: moves all visible extremities without noticeable abnormality  PSYCH/NEURO:  pleasant and cooperative, no obvious depression or anxiety, speech and thought processing grossly intact  ASSESSMENT AND PLAN:  Discussed the following assessment and plan:  COVID-19 virus infection -given pt's SOB, dehydration, and pain advise to proceed to the nearest ED for further eval and IFVs   f/u prn  I discussed the assessment and treatment plan with the patient. The patient was provided an opportunity to ask questions and all were answered. The patient agreed with the plan and demonstrated an understanding of the instructions.   The patient was advised to call back or seek an in-person evaluation if the symptoms worsen or if the condition fails to improve as anticipated.  I provided 18:25 minutes of non-face-to-face time during this encounter.   Deeann Saint, MD

## 2019-06-03 ENCOUNTER — Other Ambulatory Visit: Payer: Self-pay

## 2019-06-03 ENCOUNTER — Emergency Department
Admission: EM | Admit: 2019-06-03 | Discharge: 2019-06-03 | Disposition: A | Discharge status: Home / Self Care | Payer: BC Managed Care – PPO | Attending: Emergency Medicine | Admitting: Emergency Medicine

## 2019-06-03 ENCOUNTER — Emergency Department: Payer: BC Managed Care – PPO

## 2019-06-03 ENCOUNTER — Other Ambulatory Visit: Payer: Self-pay | Admitting: Family Medicine

## 2019-06-03 DIAGNOSIS — Z79899 Other long term (current) drug therapy: Secondary | ICD-10-CM | POA: Insufficient documentation

## 2019-06-03 DIAGNOSIS — U071 COVID-19: Secondary | ICD-10-CM | POA: Diagnosis not present

## 2019-06-03 DIAGNOSIS — R0602 Shortness of breath: Secondary | ICD-10-CM | POA: Diagnosis not present

## 2019-06-03 DIAGNOSIS — K219 Gastro-esophageal reflux disease without esophagitis: Secondary | ICD-10-CM

## 2019-06-03 DIAGNOSIS — R05 Cough: Secondary | ICD-10-CM | POA: Diagnosis not present

## 2019-06-03 MED ORDER — PREDNISONE 20 MG PO TABS: 60.0000 mg | ORAL_TABLET | Freq: Every day | ORAL | 0 refills | Status: DC

## 2019-06-03 MED ORDER — BENZONATATE 100 MG PO CAPS: 100.0000 mg | ORAL_CAPSULE | Freq: Three times a day (TID) | ORAL | 0 refills | Status: DC | PRN

## 2019-06-03 MED ORDER — AZITHROMYCIN 500 MG PO TABS: 500.0000 mg | ORAL_TABLET | Freq: Every day | ORAL | 0 refills | Status: DC

## 2019-06-03 MED ORDER — AZITHROMYCIN 500 MG PO TABS: 500.0000 mg | ORAL_TABLET | Freq: Every day | ORAL | 0 refills | Status: AC

## 2019-06-03 MED ORDER — ALBUTEROL SULFATE HFA 108 (90 BASE) MCG/ACT IN AERS: 2.0000 | INHALATION_SPRAY | Freq: Four times a day (QID) | RESPIRATORY_TRACT | 0 refills | Status: DC | PRN

## 2019-06-03 MED ORDER — PREDNISONE 20 MG PO TABS: 60.0000 mg | ORAL_TABLET | Freq: Every day | ORAL | 0 refills | Status: AC

## 2019-06-03 MED ORDER — BENZONATATE 100 MG PO CAPS
100.0000 mg | ORAL_CAPSULE | Freq: Once | ORAL | Status: AC
  Administered 2019-06-03: 100 mg via ORAL
  Filled 2019-06-03: qty 1

## 2019-06-03 MED ORDER — PREDNISONE 20 MG PO TABS
60.0000 mg | ORAL_TABLET | Freq: Once | ORAL | Status: AC
  Administered 2019-06-03: 60 mg via ORAL
  Filled 2019-06-03: qty 3
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
## 2019-06-03 NOTE — ED Triage Notes (Signed)
Pt arrives to ED via POV from home with c/o "Covid symptoms". Pt reports (+) result received on Tuesday. Pt states fever on Tues and Wed, but none since. Pt reports nausea and 1 episode of emesis yesterday morning. Pt states she spoke to her PCP who recommended pt come to the ED for evaluation. Pt is A&O, in NAD; RR even, regular, and unlabored.

## 2019-06-04 ENCOUNTER — Other Ambulatory Visit: Payer: Self-pay | Admitting: Family Medicine

## 2019-06-04 DIAGNOSIS — K219 Gastro-esophageal reflux disease without esophagitis: Secondary | ICD-10-CM

## 2019-06-05 MED ORDER — PANTOPRAZOLE SODIUM 40 MG PO TBEC: 40.0000 mg | DELAYED_RELEASE_TABLET | Freq: Every day | ORAL | 1 refills | Status: DC

## 2019-06-08 ENCOUNTER — Telehealth: Payer: Self-pay | Admitting: Family Medicine

## 2019-06-08 NOTE — Telephone Encounter (Signed)
Patient is calling to let Dr. Volanda Napoleon know that she will be faxing documents to be complete paperwork for paid sick leave while she had COVID. Forms to be completed for her job. So the patient can return back to work. Patient is going to fax the paperwork

## 2019-06-09 NOTE — Telephone Encounter (Signed)
Noted  

## 2019-06-12 ENCOUNTER — Telehealth: Payer: Self-pay

## 2019-06-12 ENCOUNTER — Telehealth (INDEPENDENT_AMBULATORY_CARE_PROVIDER_SITE_OTHER): Payer: BC Managed Care – PPO | Admitting: Family Medicine

## 2019-06-12 DIAGNOSIS — Z8619 Personal history of other infectious and parasitic diseases: Secondary | ICD-10-CM

## 2019-06-12 DIAGNOSIS — Z8616 Personal history of COVID-19: Secondary | ICD-10-CM

## 2019-06-12 NOTE — Telephone Encounter (Signed)
Copied from [place] [id number]. Topic: General - Inquiry >> Jun 12, 2019 11:18 AM Alease Frame wrote: Reason for CRM: Patient is needing a back to work form due to testing positive from covid . Please advise

## 2019-06-12 NOTE — Telephone Encounter (Signed)
Pt is scheduled for a virtual visit with Dr Volanda Napoleon this afternoon

## 2019-06-12 NOTE — Progress Notes (Signed)
Virtual Visit via Video Note  I connected with Kim Lloyd on 06/12/19 at  4:30 PM EST by a video enabled telemedicine application 2/2 [id number] pandemic and verified that I am speaking with the correct person using two identifiers.  Location patient: home Location provider:work or home office Persons participating in the virtual visit: patient, provider  I discussed the limitations of evaluation and management by telemedicine and the availability of in person appointments. The patient expressed understanding and agreed to proceed.   HPI: Pt and her bf dx'd with COVID-19 on 11/9.   Seen in ED 11/14 for SOB, CXR normal, d/c'd home with albuterol, Azithromycin, tessalon, and prednisone.  Since last visit, pt states she started feeling better 5 days ago.  No longer on medication.  Notes increased appetite.  Denies cough, fever, chills, HA.  Pt is hoping to get back to work, but needs a note.  Pt also inquires if office received paperwork for to get sick pay.   ROS: See pertinent positives and negatives per HPI.  Past Medical History:  Diagnosis Date  . Allergy   . Anxiety   . Bipolar 1 disorder ([place])   . Depression   . Eating disorder    remission  . Gall bladder disease    gallbladder sluge which led to jaundice  . GERD (gastroesophageal reflux disease)   . History of chicken pox    age 28  . Migraine headache without aura   . Overdose drug     No past surgical history on file.  Family History  Problem Relation Age of Onset  . Mental illness Mother   . Alcohol abuse Mother   . Mental illness Father   . Mental illness Brother   . Alcohol abuse Maternal Aunt   . Drug abuse Maternal Aunt   . Hypothyroidism Maternal Grandmother   . Coronary artery disease Maternal Grandfather      Current Outpatient Medications:  .  albuterol (VENTOLIN HFA) 108 (90 Base) MCG/ACT inhaler, Inhale 2 puffs into the lungs every 6 (six) hours as needed for wheezing or shortness of breath., Disp:  1 g, Rfl: 0 .  benzonatate (TESSALON PERLES) 100 MG capsule, Take 1 capsule (100 mg total) by mouth 3 (three) times daily as needed., Disp: 30 capsule, Rfl: 0 .  DAYSEE 0.15-0.03 &0.01 MG tablet, TAKE ONE TABLET BY MOUTH DAILY, Disp: 91 tablet, Rfl: 3 .  famotidine (PEPCID) 20 MG tablet, Take 2 tablets (40 mg total) by mouth at bedtime., Disp: 60 tablet, Rfl: 3 .  gabapentin (NEURONTIN) 300 MG capsule, Take 300 mg by mouth 3 (three) times daily., Disp: , Rfl: 0 .  lamoTRIgine (LAMICTAL) 25 MG tablet, Take 25 mg by mouth daily., Disp: , Rfl:  .  Norgestimate-Ethinyl Estradiol Triphasic (TRI-LO-SPRINTEC) 0.18/0.215/0.25 MG-25 MCG tab, Take 1 tablet by mouth daily., Disp: 3 Package, Rfl: 3 .  ondansetron (ZOFRAN) 4 MG tablet, Take 1 tablet (4 mg total) by mouth every 8 (eight) hours as needed for nausea or vomiting. (Patient not taking: Reported on 02/08/2019), Disp: 20 tablet, Rfl: 0 .  pantoprazole (PROTONIX) 40 MG tablet, Take 1 tablet (40 mg total) by mouth daily with breakfast., Disp: 90 tablet, Rfl: 1  EXAM:  VITALS per patient if applicable: RR between 03-50 bpm  GENERAL: alert, oriented, appears well and in no acute distress  HEENT: atraumatic, conjunctiva clear, no obvious abnormalities on inspection of external nose and ears  NECK: normal movements of the head and neck  LUNGS:  on inspection no signs of respiratory distress, breathing rate appears normal, no obvious gross SOB, gasping or wheezing  CV: no obvious cyanosis  MS: moves all visible extremities without noticeable abnormality  PSYCH/NEURO: pleasant and cooperative, no obvious depression or anxiety, speech and thought processing grossly intact  ASSESSMENT AND PLAN:  Discussed the following assessment and plan:  History of 2019 novel coronavirus disease (COVID-19) -symptoms now resolved -pt self quarantined x 14 +days -will provide return to work letter -We will complete paperwork for sick pay/sick leave -Given  precautions  Follow-up as needed   I discussed the assessment and treatment plan with the patient. The patient was provided an opportunity to ask questions and all were answered. The patient agreed with the plan and demonstrated an understanding of the instructions.   The patient was advised to call back or seek an in-person evaluation if the symptoms worsen or if the condition fails to improve as anticipated.   Deeann Saint, MD

## 2019-06-14 NOTE — Telephone Encounter (Signed)
Pt Forms have been completed and faxed, pt state that she will email the pages needed for her to complete to [employer]. Confirmation received

## 2019-08-04 ENCOUNTER — Ambulatory Visit: Payer: BLUE CROSS/BLUE SHIELD | Attending: Internal Medicine

## 2019-08-04 DIAGNOSIS — Z20822 Contact with and (suspected) exposure to covid-19: Secondary | ICD-10-CM

## 2019-08-05 LAB — NOVEL CORONAVIRUS, NAA: SARS-CoV-2, NAA: NOT DETECTED

## 2019-08-23 ENCOUNTER — Emergency Department
Admission: EM | Admit: 2019-08-23 | Discharge: 2019-08-23 | Disposition: A | Discharge status: Home / Self Care | Payer: BLUE CROSS/BLUE SHIELD | Attending: Emergency Medicine | Admitting: Emergency Medicine

## 2019-08-23 ENCOUNTER — Emergency Department: Payer: BLUE CROSS/BLUE SHIELD

## 2019-08-23 ENCOUNTER — Other Ambulatory Visit: Payer: Self-pay

## 2019-08-23 DIAGNOSIS — Y929 Unspecified place or not applicable: Secondary | ICD-10-CM | POA: Diagnosis not present

## 2019-08-23 DIAGNOSIS — Y939 Activity, unspecified: Secondary | ICD-10-CM | POA: Insufficient documentation

## 2019-08-23 DIAGNOSIS — W540XXA Bitten by dog, initial encounter: Secondary | ICD-10-CM

## 2019-08-23 DIAGNOSIS — Y999 Unspecified external cause status: Secondary | ICD-10-CM | POA: Insufficient documentation

## 2019-08-23 DIAGNOSIS — W5581XA Bitten by other mammals, initial encounter: Secondary | ICD-10-CM | POA: Insufficient documentation

## 2019-08-23 DIAGNOSIS — S6992XA Unspecified injury of left wrist, hand and finger(s), initial encounter: Secondary | ICD-10-CM | POA: Insufficient documentation

## 2019-08-23 DIAGNOSIS — Z79899 Other long term (current) drug therapy: Secondary | ICD-10-CM | POA: Diagnosis not present

## 2019-08-23 LAB — CBC WITH DIFFERENTIAL/PLATELET
Abs Immature Granulocytes: 0.03 10*3/uL (ref 0.00–0.07)
Basophils Absolute: 0 10*3/uL (ref 0.0–0.1)
Basophils Relative: 0 %
Eosinophils Absolute: 0.1 10*3/uL (ref 0.0–0.5)
Eosinophils Relative: 1 %
HCT: 35.9 % — ABNORMAL LOW (ref 36.0–46.0)
Hemoglobin: 12.4 g/dL (ref 12.0–15.0)
Immature Granulocytes: 0 %
Lymphocytes Relative: 28 %
Lymphs Abs: 2.1 10*3/uL (ref 0.7–4.0)
MCH: 29.5 pg (ref 26.0–34.0)
MCHC: 34.5 g/dL (ref 30.0–36.0)
MCV: 85.5 fL (ref 80.0–100.0)
Monocytes Absolute: 0.5 10*3/uL (ref 0.1–1.0)
Monocytes Relative: 6 %
Neutro Abs: 4.8 10*3/uL (ref 1.7–7.7)
Neutrophils Relative %: 65 %
Platelets: 258 10*3/uL (ref 150–400)
RBC: 4.2 MIL/uL (ref 3.87–5.11)
RDW: 13 % (ref 11.5–15.5)
WBC: 7.5 10*3/uL (ref 4.0–10.5)
nRBC: 0 % (ref 0.0–0.2)

## 2019-08-23 LAB — BASIC METABOLIC PANEL
Anion gap: 6 (ref 5–15)
BUN: 11 mg/dL (ref 6–20)
CO2: 24 mmol/L (ref 22–32)
Calcium: 8.9 mg/dL (ref 8.9–10.3)
Chloride: 106 mmol/L (ref 98–111)
Creatinine, Ser: 0.72 mg/dL (ref 0.44–1.00)
GFR calc Af Amer: >60 mL/min (ref >60)
GFR calc non Af Amer: >60 mL/min (ref >60)
Glucose, Bld: 97 mg/dL (ref 70–99)
Potassium: 3.9 mmol/L (ref 3.5–5.1)
Sodium: 136 mmol/L (ref 135–145)

## 2019-08-23 MED ORDER — NAPROXEN 500 MG PO TABS: 500.0000 mg | ORAL_TABLET | Freq: Two times a day (BID) | ORAL | Status: DC

## 2019-08-23 MED ORDER — SODIUM CHLORIDE 0.9 % IV SOLN
1.0000 g | Freq: Once | INTRAVENOUS | Status: AC
  Administered 2019-08-23: 1 g via INTRAVENOUS
  Filled 2019-08-23: qty 10

## 2019-08-23 MED ORDER — AMOXICILLIN-POT CLAVULANATE 875-125 MG PO TABS: 1.0000 | ORAL_TABLET | Freq: Two times a day (BID) | ORAL | 0 refills | Status: DC

## 2019-08-23 NOTE — ED Notes (Signed)
Signature pad not working. Pt verbalizes understanding of d/c instructions. Denies any further questions or concerns at this time.  

## 2019-08-23 NOTE — ED Notes (Signed)
Patient presents to the ED with pet prairie dog bit to the palm of her left hand.  Patient states [employer] notified animal control and they are aware that animal had received all appropriate vaccinations.  Patient states area is now swollen with puss.  Patient is having difficulty moving her forefinger.

## 2019-08-23 NOTE — Discharge Instructions (Addendum)
Follow discharge care instructions take medication as directed.  Wear splint for 3 to 5 days as needed.
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
## 2019-08-23 NOTE — ED Triage Notes (Signed)
Pt states [facility] was sent [facility] treatment of a wound caused by a bite to the left first joint of the pointer finger by a pet prairie dog yesterday.

## 2020-04-01 ENCOUNTER — Other Ambulatory Visit: Payer: BLUE CROSS/BLUE SHIELD

## 2020-04-23 ENCOUNTER — Other Ambulatory Visit: Payer: Self-pay | Admitting: Family Medicine

## 2020-04-23 DIAGNOSIS — F3281 Premenstrual dysphoric disorder: Secondary | ICD-10-CM

## 2020-04-23 NOTE — Telephone Encounter (Signed)
Left a voice message for pt to call the office and schedule appointment for Birth control refill

## 2020-04-25 ENCOUNTER — Encounter: Payer: Self-pay | Admitting: Family Medicine

## 2020-04-25 ENCOUNTER — Telehealth (INDEPENDENT_AMBULATORY_CARE_PROVIDER_SITE_OTHER): Payer: 59 | Admitting: Family Medicine

## 2020-04-25 VITALS — Wt 160.0 lb

## 2020-04-25 DIAGNOSIS — Z3009 Encounter for other general counseling and advice on contraception: Secondary | ICD-10-CM

## 2020-04-25 NOTE — Progress Notes (Signed)
Virtual Visit via Video Note  I connected with Kim Lloyd on 04/25/20 at  1:00 PM EDT by a video enabled telemedicine application 2/2 COVID-19 pandemic and verified that I am speaking with the correct person using two identifiers.  Location patient: home Location provider:work or home office Persons participating in the virtual visit: patient, provider  I discussed the limitations of evaluation and management by telemedicine and the availability of in person appointments. The patient expressed understanding and agreed to proceed.   HPI: Pt is a 29 yo female seen for f/u.  Pt states she has been doing well.  Requesting a refill on [facility] OCPs.  States this OCP has been working well.  Patient denies history of blood clots, LE edema, calf pain, SOB, tobacco use.   ROS: See pertinent positives and negatives per HPI.  Past Medical History:  Diagnosis Date  . Allergy   . Anxiety   . Bipolar 1 disorder (H[facility])   . Depression   . Eating disorder    remission  . Gall bladder disease    gallbladder sluge which led to jaundice  . GERD (gastroesophageal reflux disease)   . History of chicken pox    age 87  . Migraine headache without aura   . Overdose drug     No past surgical history on file.  Family History  Problem Relation Age of Onset  . Mental illness Mother   . Alcohol abuse Mother   . Mental illness Father   . Mental illness Brother   . Alcohol abuse Maternal Aunt   . Drug abuse Maternal Aunt   . Hypothyroidism Maternal Grandmother   . Coronary artery disease Maternal Grandfather      Current Outpatient Medications:  .  albuterol (VENTOLIN HFA) 108 (90 Base) MCG/ACT inhaler, Inhale 2 puffs into the lungs every 6 (six) hours as needed for wheezing or shortness of breath., Disp: 1 g, Rfl: 0 .  DAYSEE 0.15-0.03 &0.01 MG tablet, TAKE ONE TABLET BY MOUTH DAILY, Disp: 91 tablet, Rfl: 3 .  famotidine (PEPCID) 20 MG tablet, Take 2 tablets (40 mg total) by mouth at bedtime.,  Disp: 60 tablet, Rfl: 3 .  gabapentin (NEURONTIN) 300 MG capsule, Take 300 mg by mouth 3 (three) times daily., Disp: , Rfl: 0 .  lamoTRIgine (LAMICTAL) 25 MG tablet, Take 25 mg by mouth daily., Disp: , Rfl:  .  naproxen (NAPROSYN) 500 MG tablet, Take 1 tablet (500 mg total) by mouth 2 (two) times daily with a meal., Disp: 20 tablet, Rfl: 00 .  pantoprazole (PROTONIX) 40 MG tablet, Take 1 tablet (40 mg total) by mouth daily with breakfast., Disp: 90 tablet, Rfl: 1  EXAM:  VITALS per patient if applicable: RR between 12-20 bpm  GENERAL: alert, oriented, appears well and in no acute distress  HEENT: atraumatic, conjunctiva clear, no obvious abnormalities on inspection of external nose and ears  NECK: normal movements of the head and neck  LUNGS: on inspection no signs of respiratory distress, breathing rate appears normal, no obvious gross SOB, gasping or wheezing  CV: no obvious cyanosis  MS: moves all visible extremities without noticeable abnormality  PSYCH/NEURO: pleasant and cooperative, no obvious depression or anxiety, speech and thought processing grossly intact  ASSESSMENT AND PLAN:  Discussed the following assessment and plan:  Birth control counseling  -Stable on current OCPs, will refill. -Kim Lloyd 0.15, 0.03, 0.01 mg tabs refilled via refill request encounter from 04/24/2020. -Patient to check with pharmacy in regards to receipt  of new prescription.  If needed patient will contact office for any prescription issues.  Follow-up as needed    I discussed the assessment and treatment plan with the patient. The patient was provided an opportunity to ask questions and all were answered. The patient agreed with the plan and demonstrated an understanding of the instructions.   The patient was advised to call back or seek an in-person evaluation if the symptoms worsen or if the condition fails to improve as anticipated.  Deeann Saint, MD

## 2020-08-05 ENCOUNTER — Other Ambulatory Visit: Payer: Self-pay | Admitting: Family Medicine

## 2020-08-05 DIAGNOSIS — K219 Gastro-esophageal reflux disease without esophagitis: Secondary | ICD-10-CM

## 2020-11-18 ENCOUNTER — Other Ambulatory Visit: Payer: Self-pay | Admitting: Family Medicine

## 2020-11-18 DIAGNOSIS — K219 Gastro-esophageal reflux disease without esophagitis: Secondary | ICD-10-CM

## 2021-04-21 ENCOUNTER — Other Ambulatory Visit: Payer: Self-pay | Admitting: Family Medicine

## 2021-04-21 DIAGNOSIS — K219 Gastro-esophageal reflux disease without esophagitis: Secondary | ICD-10-CM

## 2021-04-21 DIAGNOSIS — F3281 Premenstrual dysphoric disorder: Secondary | ICD-10-CM

## 2021-04-28 ENCOUNTER — Other Ambulatory Visit: Payer: Self-pay | Admitting: Family Medicine

## 2021-04-28 DIAGNOSIS — F3281 Premenstrual dysphoric disorder: Secondary | ICD-10-CM

## 2021-05-13 ENCOUNTER — Ambulatory Visit (HOSPITAL_COMMUNITY)
Admission: RE | Admit: 2021-05-13 | Discharge: 2021-05-13 | Disposition: A | Discharge status: Home / Self Care | Payer: Commercial Managed Care - HMO | Attending: Emergency Medicine | Admitting: Emergency Medicine

## 2021-05-13 DIAGNOSIS — F332 Major depressive disorder, recurrent severe without psychotic features: Secondary | ICD-10-CM | POA: Insufficient documentation

## 2021-05-13 MED ORDER — HYDROXYZINE HCL 25 MG PO TABS
25.0000 mg | ORAL_TABLET | Freq: Three times a day (TID) | ORAL | Status: DC | PRN
  Filled 2021-05-13: qty 1

## 2021-05-13 MED ORDER — ALUM & MAG HYDROXIDE-SIMETH 200-200-20 MG/5ML PO SUSP
30.0000 mL | ORAL | Status: DC | PRN
  Filled 2021-05-13: qty 30

## 2021-05-13 MED ORDER — CLONIDINE HCL 0.3 MG PO TABS
0.3000 mg | ORAL_TABLET | Freq: Every day | ORAL | Status: DC
  Filled 2021-05-13 (×2): qty 1

## 2021-05-13 MED ORDER — ACETAMINOPHEN 325 MG PO TABS
650.0000 mg | ORAL_TABLET | Freq: Four times a day (QID) | ORAL | Status: DC | PRN
  Filled 2021-05-13: qty 2

## 2021-05-13 MED ORDER — TRAZODONE HCL 50 MG PO TABS
50.0000 mg | ORAL_TABLET | Freq: Every evening | ORAL | Status: DC | PRN
  Filled 2021-05-13: qty 1

## 2021-05-13 MED ORDER — MAGNESIUM HYDROXIDE 400 MG/5ML PO SUSP
30.0000 mL | Freq: Every day | ORAL | Status: DC | PRN
  Filled 2021-05-13: qty 30

## 2021-05-14 ENCOUNTER — Encounter (HOSPITAL_COMMUNITY): Payer: Self-pay | Admitting: Physician Assistant

## 2021-05-14 ENCOUNTER — Other Ambulatory Visit: Payer: Self-pay

## 2021-05-14 ENCOUNTER — Inpatient Hospital Stay (HOSPITAL_COMMUNITY)
Admission: AD | Admit: 2021-05-14 | Discharge: 2021-05-19 | DRG: 885 | Disposition: A | Discharge status: Home / Self Care | Payer: Commercial Managed Care - HMO | Attending: Emergency Medicine | Admitting: Emergency Medicine

## 2021-05-14 ENCOUNTER — Encounter (HOSPITAL_COMMUNITY): Payer: Self-pay

## 2021-05-14 DIAGNOSIS — F332 Major depressive disorder, recurrent severe without psychotic features: Secondary | ICD-10-CM | POA: Diagnosis not present

## 2021-05-14 DIAGNOSIS — R45851 Suicidal ideations: Secondary | ICD-10-CM | POA: Diagnosis present

## 2021-05-14 DIAGNOSIS — F4310 Post-traumatic stress disorder, unspecified: Secondary | ICD-10-CM | POA: Diagnosis present

## 2021-05-14 DIAGNOSIS — Z20822 Contact with and (suspected) exposure to covid-19: Secondary | ICD-10-CM | POA: Diagnosis present

## 2021-05-14 DIAGNOSIS — K219 Gastro-esophageal reflux disease without esophagitis: Secondary | ICD-10-CM | POA: Diagnosis present

## 2021-05-14 DIAGNOSIS — Z818 Family history of other mental and behavioral disorders: Secondary | ICD-10-CM

## 2021-05-14 DIAGNOSIS — Z9151 Personal history of suicidal behavior: Secondary | ICD-10-CM | POA: Diagnosis not present

## 2021-05-14 DIAGNOSIS — Z79899 Other long term (current) drug therapy: Secondary | ICD-10-CM | POA: Diagnosis not present

## 2021-05-14 DIAGNOSIS — R110 Nausea: Secondary | ICD-10-CM | POA: Diagnosis present

## 2021-05-14 DIAGNOSIS — F909 Attention-deficit hyperactivity disorder, unspecified type: Secondary | ICD-10-CM | POA: Diagnosis present

## 2021-05-14 LAB — CBC WITH DIFFERENTIAL/PLATELET
Abs Immature Granulocytes: 0.02 10*3/uL (ref 0.00–0.07)
Basophils Absolute: 0 10*3/uL (ref 0.0–0.1)
Basophils Relative: 0 %
Eosinophils Absolute: 0.1 10*3/uL (ref 0.0–0.5)
Eosinophils Relative: 1 %
HCT: 42 % (ref 36.0–46.0)
Hemoglobin: 14.1 g/dL (ref 12.0–15.0)
Immature Granulocytes: 0 %
Lymphocytes Relative: 41 %
Lymphs Abs: 2.6 10*3/uL (ref 0.7–4.0)
MCH: 27.8 pg (ref 26.0–34.0)
MCHC: 33.6 g/dL (ref 30.0–36.0)
MCV: 82.7 fL (ref 80.0–100.0)
Monocytes Absolute: 0.3 10*3/uL (ref 0.1–1.0)
Monocytes Relative: 5 %
Neutro Abs: 3.3 10*3/uL (ref 1.7–7.7)
Neutrophils Relative %: 53 %
Platelets: 399 10*3/uL (ref 150–400)
RBC: 5.08 MIL/uL (ref 3.87–5.11)
RDW: 14.2 % (ref 11.5–15.5)
WBC: 6.4 10*3/uL (ref 4.0–10.5)
nRBC: 0 % (ref 0.0–0.2)

## 2021-05-14 LAB — COMPREHENSIVE METABOLIC PANEL
ALT: 16 U/L (ref 0–44)
AST: 14 U/L — ABNORMAL LOW (ref 15–41)
Albumin: 4.5 g/dL (ref 3.5–5.0)
Alkaline Phosphatase: 70 U/L (ref 38–126)
Anion gap: 8 (ref 5–15)
BUN: 8 mg/dL (ref 6–20)
CO2: 24 mmol/L (ref 22–32)
Calcium: 9.6 mg/dL (ref 8.9–10.3)
Chloride: 103 mmol/L (ref 98–111)
Creatinine, Ser: 0.95 mg/dL (ref 0.44–1.00)
GFR, Estimated: >60 mL/min (ref >60)
Glucose, Bld: 95 mg/dL (ref 70–99)
Potassium: 4.1 mmol/L (ref 3.5–5.1)
Sodium: 135 mmol/L (ref 135–145)
Total Bilirubin: 1.6 mg/dL — ABNORMAL HIGH (ref 0.3–1.2)
Total Protein: 8.1 g/dL (ref 6.5–8.1)

## 2021-05-14 LAB — RESP PANEL BY RT-PCR (FLU A&B, COVID) ARPGX2
Influenza A by PCR: NEGATIVE
Influenza B by PCR: NEGATIVE
SARS Coronavirus 2 by RT PCR: NEGATIVE

## 2021-05-14 LAB — LIPID PANEL
Cholesterol: 200 mg/dL (ref 0–200)
HDL: 64 mg/dL (ref >40)
LDL Cholesterol: 121 mg/dL — ABNORMAL HIGH (ref 0–99)
Total CHOL/HDL Ratio: 3.1 RATIO
Triglycerides: 75 mg/dL (ref <150)
VLDL: 15 mg/dL (ref 0–40)

## 2021-05-14 LAB — HEMOGLOBIN A1C
Hgb A1c MFr Bld: 4.6 % — ABNORMAL LOW (ref 4.8–5.6)
Mean Plasma Glucose: 85.32 mg/dL

## 2021-05-14 LAB — TSH: TSH: 0.863 u[IU]/mL (ref 0.350–4.500)

## 2021-05-14 MED ORDER — LEVONORGEST-ETH ESTRAD 91-DAY 0.15-0.03 &0.01 MG PO TABS: 1.0000 | ORAL_TABLET | Freq: Every day | ORAL | Status: DC

## 2021-05-14 MED ORDER — SERTRALINE HCL 25 MG PO TABS
25.0000 mg | ORAL_TABLET | Freq: Every day | ORAL | Status: DC
  Administered 2021-05-14 – 2021-05-18 (×5): 25 mg via ORAL
  Filled 2021-05-14 (×8): qty 1

## 2021-05-14 MED ORDER — ALUM & MAG HYDROXIDE-SIMETH 200-200-20 MG/5ML PO SUSP
30.0000 mL | ORAL | Status: DC | PRN
Start: 2021-05-14 — End: 2021-05-19

## 2021-05-14 MED ORDER — BUSPIRONE HCL 10 MG PO TABS
10.0000 mg | ORAL_TABLET | Freq: Two times a day (BID) | ORAL | Status: DC
  Administered 2021-05-14 – 2021-05-19 (×10): 10 mg via ORAL
  Filled 2021-05-14 (×12): qty 1
  Filled 2021-05-14: qty 2
  Filled 2021-05-14 (×2): qty 1

## 2021-05-14 MED ORDER — BUPROPION HCL ER (XL) 300 MG PO TB24
300.0000 mg | ORAL_TABLET | Freq: Every day | ORAL | Status: DC
  Administered 2021-05-14 – 2021-05-18 (×5): 300 mg via ORAL
  Filled 2021-05-14 (×7): qty 1

## 2021-05-14 MED ORDER — TRAZODONE HCL 50 MG PO TABS: 50.0000 mg | ORAL_TABLET | Freq: Every evening | ORAL | Status: DC | PRN

## 2021-05-14 MED ORDER — LEVONORGEST-ETH ESTRAD 91-DAY 0.15-0.03 &0.01 MG PO TABS
1.0000 | ORAL_TABLET | Freq: Every day | ORAL | Status: DC
  Administered 2021-05-14 – 2021-05-16 (×3): 1 via ORAL

## 2021-05-14 MED ORDER — MAGNESIUM HYDROXIDE 400 MG/5ML PO SUSP
30.0000 mL | Freq: Every day | ORAL | Status: DC | PRN
Start: 2021-05-14 — End: 2021-05-19

## 2021-05-14 MED ORDER — BUSPIRONE HCL 15 MG PO TABS
15.0000 mg | ORAL_TABLET | Freq: Two times a day (BID) | ORAL | Status: DC
  Administered 2021-05-14: 15 mg via ORAL
  Filled 2021-05-14 (×5): qty 1

## 2021-05-14 MED ORDER — LEVONORGEST-ETH ESTRAD 91-DAY 0.15-0.03 &0.01 MG PO TABS
1.0000 | ORAL_TABLET | Freq: Once | ORAL | Status: AC
  Administered 2021-05-14: 1 via ORAL

## 2021-05-14 MED ORDER — FAMOTIDINE 40 MG PO TABS
40.0000 mg | ORAL_TABLET | Freq: Every day | ORAL | Status: DC
  Administered 2021-05-14 – 2021-05-18 (×5): 40 mg via ORAL
  Filled 2021-05-14 (×7): qty 1

## 2021-05-14 MED ORDER — HYDROXYZINE HCL 25 MG PO TABS
25.0000 mg | ORAL_TABLET | Freq: Three times a day (TID) | ORAL | Status: DC | PRN
  Administered 2021-05-14: 25 mg via ORAL
  Filled 2021-05-14: qty 1

## 2021-05-14 MED ORDER — ACETAMINOPHEN 325 MG PO TABS
650.0000 mg | ORAL_TABLET | Freq: Four times a day (QID) | ORAL | Status: DC | PRN
  Administered 2021-05-14 – 2021-05-17 (×4): 650 mg via ORAL
  Filled 2021-05-14 (×4): qty 2

## 2021-05-14 MED ORDER — CLONIDINE HCL 0.3 MG PO TABS
0.3000 mg | ORAL_TABLET | Freq: Every day | ORAL | Status: DC
  Administered 2021-05-14 – 2021-05-18 (×5): 0.3 mg via ORAL
  Filled 2021-05-14 (×8): qty 1

## 2021-05-14 MED ORDER — PANTOPRAZOLE SODIUM 40 MG PO TBEC
40.0000 mg | DELAYED_RELEASE_TABLET | Freq: Every day | ORAL | Status: DC
  Administered 2021-05-14 – 2021-05-19 (×6): 40 mg via ORAL
  Filled 2021-05-14 (×8): qty 1

## 2021-05-14 MED ORDER — LAMOTRIGINE 150 MG PO TABS
150.0000 mg | ORAL_TABLET | Freq: Every day | ORAL | Status: DC
  Administered 2021-05-14: 150 mg via ORAL
  Filled 2021-05-14 (×3): qty 1

## 2021-05-14 NOTE — BH IP Treatment Plan (Signed)
Interdisciplinary Treatment and Diagnostic Plan Update  05/14/2021 Time of Session: 9:20am Kim Lloyd MRN: [id number]  Principal Diagnosis: <principal problem not specified>  Secondary Diagnoses: Active Problems:   MDD (major depressive disorder), recurrent episode, severe (HCC)   Current Medications:  Current Facility-Administered Medications  Medication Dose Route Frequency Provider Last Rate Last Admin   acetaminophen (TYLENOL) tablet 650 mg  650 mg Oral Q6H PRN Nwoko, Uchenna E, PA   650 mg at 05/14/21 1242   alum & mag hydroxide-simeth (MAALOX/MYLANTA) 200-200-20 MG/5ML suspension 30 mL  30 mL Oral Q4H PRN Nwoko, Uchenna E, PA       buPROPion (WELLBUTRIN XL) 24 hr tablet 300 mg  300 mg Oral QHS Nwoko, Uchenna E, PA       busPIRone (BUSPAR) tablet 15 mg  15 mg Oral BID Nwoko, Uchenna E, PA   15 mg at 05/14/21 0900   cloNIDine (CATAPRES) tablet 0.3 mg  0.3 mg Oral QHS Nwoko, Uchenna E, PA       famotidine (PEPCID) tablet 40 mg  40 mg Oral QHS Nwoko, Uchenna E, PA       hydrOXYzine (ATARAX/VISTARIL) tablet 25 mg  25 mg Oral TID PRN Nwoko, Uchenna E, PA   25 mg at 05/14/21 1242   lamoTRIgine (LAMICTAL) tablet 150 mg  150 mg Oral QHS Nwoko, Uchenna E, PA       magnesium hydroxide (MILK OF MAGNESIA) suspension 30 mL  30 mL Oral Daily PRN Nwoko, Uchenna E, PA       pantoprazole (PROTONIX) EC tablet 40 mg  40 mg Oral Daily Nwoko, Uchenna E, PA   40 mg at 05/14/21 0900   traZODone (DESYREL) tablet 50 mg  50 mg Oral QHS PRN Nwoko, Uchenna E, PA       PTA Medications: Medications Prior to Admission  Medication Sig Dispense Refill Last Dose   ADZENYS XR-ODT 6.3 MG TBED Take 1 tablet by mouth daily.   Past Month   buPROPion (WELLBUTRIN XL) 300 MG 24 hr tablet Take 300 mg by mouth daily.   Past Week   busPIRone (BUSPAR) 15 MG tablet Take 15 mg by mouth 2 (two) times daily.   Past Week   cloNIDine (CATAPRES) 0.3 MG tablet Take 0.3 mg by mouth at bedtime.   Past Week   famotidine  (PEPCID) 20 MG tablet Take 2 tablets (40 mg total) by mouth at bedtime. 60 tablet 3 Past Week   [name] 0.15-0.03 &0.01 MG tablet TAKE ONE TABLET BY MOUTH DAILY 91 tablet 3 Past Week   lamoTRIgine (LAMICTAL) 150 MG tablet Take 150 mg by mouth daily.   Past Week   pantoprazole (PROTONIX) 40 MG tablet TAKE ONE TABLET BY MOUTH EVERY MORNING BEFORE BREAKFAST 90 tablet 0 Past Week    Patient Stressors: Educational concerns   Financial difficulties   Marital or family conflict   Occupational concerns    Patient Strengths: Ability for insight  General fund of knowledge  Motivation for treatment/growth  Work skills   Treatment Modalities: Medication Management, Group therapy, Case management,  1 to 1 session with clinician, Psychoeducation, Recreational therapy.   Physician Treatment Plan for Primary Diagnosis: <principal problem not specified> Long Term Goal(s): Improvement in symptoms so as ready for discharge   Short Term Goals: Ability to identify changes in lifestyle to reduce recurrence of condition will improve Ability to verbalize feelings will improve Ability to disclose and discuss suicidal ideas Ability to demonstrate self-control will improve Ability to identify and  develop effective coping behaviors will improve Ability to maintain clinical measurements within normal limits will improve Compliance with prescribed medications will improve Ability to identify triggers associated with substance abuse/mental health issues will improve  Medication Management: Evaluate patient's response, side effects, and tolerance of medication regimen.  Therapeutic Interventions: 1 to 1 sessions, Unit Group sessions and Medication administration.  Evaluation of Outcomes: Not Met  Physician Treatment Plan for Secondary Diagnosis: Active Problems:   MDD (major depressive disorder), recurrent episode, severe ([place])  Long Term Goal(s): Improvement in symptoms so as ready for discharge   Short  Term Goals: Ability to identify changes in lifestyle to reduce recurrence of condition will improve Ability to verbalize feelings will improve Ability to disclose and discuss suicidal ideas Ability to demonstrate self-control will improve Ability to identify and develop effective coping behaviors will improve Ability to maintain clinical measurements within normal limits will improve Compliance with prescribed medications will improve Ability to identify triggers associated with substance abuse/mental health issues will improve     Medication Management: Evaluate patient's response, side effects, and tolerance of medication regimen.  Therapeutic Interventions: 1 to 1 sessions, Unit Group sessions and Medication administration.  Evaluation of Outcomes: Not Met   RN Treatment Plan for Primary Diagnosis: <principal problem not specified> Long Term Goal(s): Knowledge of disease and therapeutic regimen to maintain health will improve  Short Term Goals: Ability to remain free from injury will improve, Ability to participate in decision making will improve, Ability to verbalize feelings will improve, Ability to disclose and discuss suicidal ideas, and Ability to identify and develop effective coping behaviors will improve  Medication Management: RN will administer medications as ordered by provider, will assess and evaluate patient's response and provide education to patient for prescribed medication. RN will report any adverse and/or side effects to prescribing provider.  Therapeutic Interventions: 1 on 1 counseling sessions, Psychoeducation, Medication administration, Evaluate responses to treatment, Monitor vital signs and CBGs as ordered, Perform/monitor CIWA, COWS, AIMS and Fall Risk screenings as ordered, Perform wound care treatments as ordered.  Evaluation of Outcomes: Not Met   LCSW Treatment Plan for Primary Diagnosis: <principal problem not specified> Long Term Goal(s): Safe  transition to appropriate next level of care at discharge, Engage patient in therapeutic group addressing interpersonal concerns.  Short Term Goals: Engage patient in aftercare planning with referrals and resources, Increase social support, Increase emotional regulation, Facilitate acceptance of mental health diagnosis and concerns, Identify triggers associated with mental health/substance abuse issues, and Increase skills for wellness and recovery  Therapeutic Interventions: Assess for all discharge needs, 1 to 1 time with Social worker, Explore available resources and support systems, Assess for adequacy in community support network, Educate family and significant other(s) on suicide prevention, Complete Psychosocial Assessment, Interpersonal group therapy.  Evaluation of Outcomes: Not Met   Progress in Treatment: Attending groups: Yes. Participating in groups: Yes. Taking medication as prescribed: Yes. Toleration medication: Yes. Family/Significant other contact made: Yes, individual(s) contacted:  Boyfriend  Patient understands diagnosis: Yes. and No. Discussing patient identified problems/goals with staff: Yes. Medical problems stabilized or resolved: Yes. Denies suicidal/homicidal ideation: Yes. Issues/concerns per patient self-inventory: No.   New problem(s) identified: No, Describe:  None   New Short Term/Long Term Goal(s): medication stabilization, elimination of SI thoughts, development of comprehensive mental wellness plan.   Patient Goals: "To learn how to manage my anxiety and gain coping skills"   Discharge Plan or Barriers: Patient recently admitted. CSW will continue to follow and assess for  appropriate referrals and possible discharge planning.   Reason for Continuation of Hospitalization: Depression Medication stabilization Suicidal ideation  Estimated Length of Stay: 3 to 5 days    Scribe for Treatment Team: Darleen Crocker, Latanya Presser 05/14/2021 2:40 PM
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
## 2021-05-14 NOTE — Group Note (Signed)
Recreation Therapy Group Note   Group Topic:Stress Management  Group Date: 05/14/2021 Start Time: 0935 End Time: 0953 Facilitators: Caroll Rancher, LRT/CTRS Location: [address] Dayroom   Goal Area(s) Addresses:  Patient will actively participate in stress management techniques presented during session.  Patient will successfully identify benefit of practicing stress management post d/c.    Group Description: Guided Imagery. LRT provided education, instruction, and demonstration on practice of visualization via guided imagery. Patient was asked to participate in the technique introduced during session. Patients were given suggestions of ways to access scripts post d/c and encouraged to explore Youtube and other apps available on smartphones, tablets, and computers.   Affect/Mood: N/A   Participation Level: Did not attend    Clinical Observations/Individualized Feedback: Pt did not attend group.    Plan: Continue to engage patient in RT group sessions 2-3x/week.   Caroll Rancher, LRT/CTRS 05/14/2021 11:57 AM

## 2021-05-14 NOTE — Progress Notes (Signed)
Patient's husband will bring in her birth control pills which are at home.
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
## 2021-05-14 NOTE — Group Note (Signed)
LCSW Group Therapy Note   Group Date: 05/14/2021 Start Time: 1300 End Time: 1330   Type of Therapy and Topic:  Group Therapy:   Therapy Type: Group Therapy  Participation Level:  Active   Patients received a worksheet with an outline of 2 gingerbread men with a separation in the middle of the page. One sign designated what the pt sees about themselves and the other is what others see. Pts were asked to introduce themselves and share something they like about themself. Pts were then asked to draw, write or color how they view themselves as well as how they are viewed by others. CSW led discussion about the feelings and words associated with each side.   Patient Summary:   During introductions pt shared their name and stated they liked her loving personality about themselves. Pt was appropriate and participated in group discussion.   Pt did not attend.  Felizardo Hoffmann, LCSWA 05/14/2021  1:22 PM
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
## 2021-05-14 NOTE — Plan of Care (Signed)
Nurse discussed anxiety and coping skills with patient. 

## 2021-05-14 NOTE — Plan of Care (Signed)
Meet with patient and discussed making medication changes.  Discussed risk and benefits of adding Zoloft and decreasing her Buspar.  She was agreeable to this.   -Start Zoloft 25 mg QHS -Decrease Buspar to 10 mg BID

## 2021-05-14 NOTE — Tx Team (Signed)
Initial Treatment Plan 05/14/2021 5:03 AM [name] TDH:[id number]    PATIENT STRESSORS: Educational concerns   Financial difficulties   Marital or family conflict   Occupational concerns     PATIENT STRENGTHS: Ability for insight  General fund of knowledge  Motivation for treatment/growth  Work skills    PATIENT IDENTIFIED PROBLEMS: Risk for SI  depression  Anxiety  "Coping skills and anxiety"               DISCHARGE CRITERIA:  Improved stabilization in mood, thinking, and/or behavior Verbal commitment to aftercare and medication compliance  PRELIMINARY DISCHARGE PLAN: Attend aftercare/continuing care group Outpatient therapy  PATIENT/FAMILY INVOLVEMENT: This treatment plan has been presented to and reviewed with the patient, Kim Lloyd.  The patient and family have been given the opportunity to ask questions and make suggestions.  Delos Haring, RN 05/14/2021, 5:03 AM
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
## 2021-05-14 NOTE — Progress Notes (Signed)
D:  Patient denied SI and HI, contracts for safety.  Denied A/V hallucinations.  Denied pain. A:  Medications administered per MD orders.  Emotional support and encouragement given patient. R:  Safety maintained with 15 minute checks.  
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
## 2021-05-14 NOTE — Progress Notes (Signed)
Patient did not attend the evening speaker NA meeting.  

## 2021-05-15 MED ORDER — ONDANSETRON 4 MG PO TBDP
4.0000 mg | ORAL_TABLET | Freq: Once | ORAL | Status: DC
  Filled 2021-05-15: qty 1

## 2021-05-15 MED ORDER — LAMOTRIGINE 100 MG PO TABS
100.0000 mg | ORAL_TABLET | Freq: Every day | ORAL | Status: DC
  Administered 2021-05-15 – 2021-05-18 (×4): 100 mg via ORAL
  Filled 2021-05-15 (×7): qty 1

## 2021-05-15 NOTE — Plan of Care (Signed)
  Problem: Education: Goal: Emotional status will improve Outcome: Progressing   Problem: Activity: Goal: Interest or engagement in activities will improve Outcome: Progressing   Problem: Education: Goal: Mental status will improve Outcome: Not Progressing   

## 2021-05-15 NOTE — [facility] Suicide Risk Assessment (Signed)
[facility] INPATIENT:  Family/Significant Other Suicide Prevention Education  Suicide Prevention Education:  Education Completed; Erskine Speed, [phone]  (name of family member/significant other) has been identified by the patient as the family member/significant other with whom the patient will be residing, and identified as the person(s) who will aid the patient in the event of a mental health crisis (suicidal ideations/suicide attempt).  With written consent from the patient, the family member/significant other has been provided the following suicide prevention education, prior to the and/or following the discharge of the patient.  Boyfriend reports that work is stressful and that she recently had a falling out with people she knew.  Boyfriend also reports that her brother has been a main source of stress due to his addiction to meth and making poor decisions.  Boyfriend reports that he has removed all guns in the house and secured medications. Boyfriend also reports that he feels that patient is insightful about her depression and her is confident that she would tell him when she needs help.  Boyfriend will be able to pick patient up upon discharge and she will return home after discharge.    The suicide prevention education provided includes the following: Suicide risk factors Suicide prevention and interventions National Suicide Hotline telephone number [facility] assessment telephone number [facility] Emergency Assistance 911 [facility] and/or Residential Mobile Crisis Unit telephone number  Request made of family/significant other to: Remove weapons (e.g., guns, rifles, knives), all items previously/currently identified as safety concern.   Remove drugs/medications (over-the-counter, prescriptions, illicit drugs), all items previously/currently identified as a safety concern.  The family member/significant other verbalizes understanding of the suicide prevention  education information provided.  The family member/significant other agrees to remove the items of safety concern listed above.  Kim Lloyd 05/15/2021, 3:27 PM

## 2021-05-15 NOTE — [facility] Group Notes (Signed)
Patient did not attend the relaxation group. 

## 2021-05-15 NOTE — Progress Notes (Addendum)
[facility] MD Progress Note  05/15/2021 11:19 AM Kim Lloyd  MRN:  [id number] Subjective:  Kim Lloyd is a 30 yr old female who presents with SI with apln to OD or shoot herself with a gun.  PPHx is significant for MDD, Recurrent, Severe, GAD, and ADHD, with 2 prior hospitalizations and 1 previous suicide attempt (OD).  Case was discussed in the multidisciplinary team. MAR was reviewed and patient was compliant with medications. She received Tylenol and Atarax yesterday.   Psychiatric Team made the following recommendations yesterday: -Continue Wellbutrin XL 300 mg QHS -Continue Lamictal 150 mg QHS -Start Zoloft 25 mg QHS -Decrease Buspar to 10 mg BID  On interview today patient reports that she is doing okay today.  She reports that she slept well last night.  She reports that her appetite has been fine.  She reports no SI, HI, or AVH.She reports that last night around midnight she did have some abdominal cramping and needed to use the bathroom urgently.  She states that she has had some episodes like this in the past.  Discussed with her that given the increase in serotonin this could be a side effect of the Zoloft, however, after discussion with her she is agreeable to continue staying on it and monitoring if this goes away.  She does report some mild nausea so I discussed giving her a dose of Zofran.  She does report that today she has not had any SI which is an improvement as yesterday she reported to [place] that she usually has some background passive SI but is able to push those thoughts away.  She reports no other concerns at present. She reports feeling that her mind is "more clear" and she shows signs of forward thinking during discussion today. She had a good visit with her boyfriend yesterday and states he is talking with her brother about moving out of their home.   Principal Problem: MDD (major depressive disorder), recurrent severe, without psychosis (HCC) Diagnosis: Principal  Problem:   MDD (major depressive disorder), recurrent severe, without psychosis (HCC) Active Problems:   PTSD (post-traumatic stress disorder)  Total Time spent with patient: 30 minutes  Past Psychiatric History: MDD, Recurrent, Severe, GAD, and ADHD, with 2 prior hospitalizations and 1 previous suicide attempt (OD)  Past Medical History:  Past Medical History:  Diagnosis Date   Allergy    Anxiety    Bipolar 1 disorder (HCC)    Depression    Eating disorder    remission   Gall bladder disease    gallbladder sluge which led to jaundice   GERD (gastroesophageal reflux disease)    History of chicken pox    age 2   Migraine headache without aura    Overdose drug    History reviewed. No pertinent surgical history. Family History:  Family History  Problem Relation Age of Onset   Mental illness Mother    Alcohol abuse Mother    Mental illness Father    Mental illness Brother    Alcohol abuse Maternal Aunt    Drug abuse Maternal Aunt    Hypothyroidism Maternal Grandmother    Coronary artery disease Maternal Grandfather    Family Psychiatric  History: Reports None Social History:  Social History   Substance and Sexual Activity  Alcohol Use Yes   Alcohol/week: 2.0 standard drinks   Types: 2 Cans of beer per week     Social History   Substance and Sexual Activity  Drug Use No  Social History   Socioeconomic History   Marital status: Single    Spouse name: Not on file   Number of children: Not on file   Years of education: Not on file   Highest education level: Not on file  Occupational History   Not on file  Tobacco Use   Smoking status: Never   Smokeless tobacco: Never  Vaping Use   Vaping Use: Never used  Substance and Sexual Activity   Alcohol use: Yes    Alcohol/week: 2.0 standard drinks    Types: 2 Cans of beer per week   Drug use: No   Sexual activity: Yes    Birth control/protection: Pill  Other Topics Concern   Not on file  Social History  Narrative   Not on file   Social Determinants of Health   Financial Resource Strain: Not on file  Food Insecurity: Not on file  Transportation Needs: Not on file  Physical Activity: Not on file  Stress: Not on file  Social Connections: Not on file   Additional Social History:                         Sleep: Good  Appetite:  Good  Current Medications: Current Facility-Administered Medications  Medication Dose Route Frequency Provider Last Rate Last Admin   acetaminophen (TYLENOL) tablet 650 mg  650 mg Oral Q6H PRN Nwoko, Uchenna E, PA   650 mg at 05/14/21 1242   alum & mag hydroxide-simeth (MAALOX/MYLANTA) 200-200-20 MG/5ML suspension 30 mL  30 mL Oral Q4H PRN Nwoko, Uchenna E, PA       buPROPion (WELLBUTRIN XL) 24 hr tablet 300 mg  300 mg Oral QHS Nwoko, Uchenna E, PA   300 mg at 05/14/21 2140   busPIRone (BUSPAR) tablet 10 mg  10 mg Oral BID Lauro Franklin, MD   10 mg at 05/15/21 1001   cloNIDine (CATAPRES) tablet 0.3 mg  0.3 mg Oral QHS Nwoko, Uchenna E, PA   0.3 mg at 05/14/21 2141   famotidine (PEPCID) tablet 40 mg  40 mg Oral QHS Nwoko, Uchenna E, PA   40 mg at 05/14/21 2140   hydrOXYzine (ATARAX/VISTARIL) tablet 25 mg  25 mg Oral TID PRN Nwoko, Uchenna E, PA   25 mg at 05/14/21 1242   lamoTRIgine (LAMICTAL) tablet 150 mg  150 mg Oral QHS Nwoko, Uchenna E, PA   150 mg at 05/14/21 2140   Levonorgestrel-Ethinyl Estradiol (AMETHIA) 0.15-0.03 &0.01 MG tablet 1 tablet  1 tablet Oral Daily Nwoko, Uchenna E, PA   1 tablet at 05/14/21 2254   magnesium hydroxide (MILK OF MAGNESIA) suspension 30 mL  30 mL Oral Daily PRN Nwoko, Uchenna E, PA       ondansetron (ZOFRAN-ODT) disintegrating tablet 4 mg  4 mg Oral Once Lauro Franklin, MD       pantoprazole (PROTONIX) EC tablet 40 mg  40 mg Oral Daily Nwoko, Uchenna E, PA   40 mg at 05/15/21 1002   sertraline (ZOLOFT) tablet 25 mg  25 mg Oral QHS Lauro Franklin, MD   25 mg at 05/14/21 2141   traZODone (DESYREL)  tablet 50 mg  50 mg Oral QHS PRN Nwoko, Tommas Olp, PA        Lab Results:  Results for orders placed or performed during the hospital encounter of 05/14/21 (from the past 48 hour(s))  CBC with Differential/Platelet     Status: None   Collection Time: 05/14/21  6:33 PM  Result Value Ref Range   WBC 6.4 4.0 - 10.5 K/uL   RBC 5.08 3.87 - 5.11 MIL/uL   Hemoglobin 14.1 12.0 - 15.0 g/dL   HCT 26.9 48.5 - 46.2 %   MCV 82.7 80.0 - 100.0 fL   MCH 27.8 26.0 - 34.0 pg   MCHC 33.6 30.0 - 36.0 g/dL   RDW 70.3 50.0 - 93.8 %   Platelets 399 150 - 400 K/uL   nRBC 0.0 0.0 - 0.2 %   Neutrophils Relative % 53 %   Neutro Abs 3.3 1.7 - 7.7 K/uL   Lymphocytes Relative 41 %   Lymphs Abs 2.6 0.7 - 4.0 K/uL   Monocytes Relative 5 %   Monocytes Absolute 0.3 0.1 - 1.0 K/uL   Eosinophils Relative 1 %   Eosinophils Absolute 0.1 0.0 - 0.5 K/uL   Basophils Relative 0 %   Basophils Absolute 0.0 0.0 - 0.1 K/uL   Immature Granulocytes 0 %   Abs Immature Granulocytes 0.02 0.00 - 0.07 K/uL    Comment: Performed at [facility], [address], [place], [place] [address]  Comprehensive metabolic panel     Status: Abnormal   Collection Time: 05/14/21  6:33 PM  Result Value Ref Range   Sodium 135 135 - 145 mmol/L   Potassium 4.1 3.5 - 5.1 mmol/L   Chloride 103 98 - 111 mmol/L   CO2 24 22 - 32 mmol/L   Glucose, Bld 95 70 - 99 mg/dL    Comment: Glucose reference range applies only to samples taken after fasting for at least 8 hours.   BUN 8 6 - 20 mg/dL   Creatinine, Ser 3.71 0.44 - 1.00 mg/dL   Calcium 9.6 8.9 - 69.6 mg/dL   Total Protein 8.1 6.5 - 8.1 g/dL   Albumin 4.5 3.5 - 5.0 g/dL   AST 14 (L) 15 - 41 U/L   ALT 16 0 - 44 U/L   Alkaline Phosphatase 70 38 - 126 U/L   Total Bilirubin 1.6 (H) 0.3 - 1.2 mg/dL   GFR, Estimated >78 >93 mL/min    Comment: (NOTE) Calculated using the CKD-EPI Creatinine Equation (2021)    Anion gap 8 5 - 15    Comment: Performed at [facility], [address], [place], [place] [address]  Hemoglobin A1c     Status: Abnormal   Collection Time: 05/14/21  6:33 PM  Result Value Ref Range   Hgb A1c MFr Bld 4.6 (L) 4.8 - 5.6 %    Comment: (NOTE) Pre diabetes:          5.7%-6.4%  Diabetes:              >6.4%  Glycemic control for   <7.0% adults with diabetes    Mean Plasma Glucose 85.32 mg/dL    Comment: Performed at [facility], [address] [place], [place] [address]  Lipid panel     Status: Abnormal   Collection Time: 05/14/21  6:33 PM  Result Value Ref Range   Cholesterol 200 0 - 200 mg/dL   Triglycerides 75 <852 mg/dL   HDL 64 >77 mg/dL   Total CHOL/HDL Ratio 3.1 RATIO   VLDL 15 0 - 40 mg/dL   LDL Cholesterol 824 (H) 0 - 99 mg/dL    Comment:        Total Cholesterol/HDL:CHD Risk Coronary Heart Disease Risk Table  Men   Women  1/2 Average Risk   3.4   3.3  Average Risk       5.0   4.4  2 X Average Risk   9.6   7.1  3 X Average Risk  23.4   11.0        Use the calculated Patient Ratio above and the CHD Risk Table to determine the patient's CHD Risk.        ATP III CLASSIFICATION (LDL):  <100     mg/dL   Optimal  542-706  mg/dL   Near or Above                    Optimal  130-159  mg/dL   Borderline  237-628  mg/dL   High  >315     mg/dL   Very High Performed at [facility], [address] [place], [place] [address]   TSH     Status: None   Collection Time: 05/14/21  6:33 PM  Result Value Ref Range   TSH 0.863 0.350 - 4.500 uIU/mL    Comment: Performed by a 3rd Generation assay with a functional sensitivity of <=0.01 uIU/mL. Performed at [facility], [address], [place], [place] [address]     Blood Alcohol level:  Lab Results  Component Value Date   [facility] <11 10/03/2011    Metabolic Disorder Labs: Lab Results  Component Value Date   HGBA1C 4.6 (L) 05/14/2021   MPG 85.32 05/14/2021   No results found for:  PROLACTIN Lab Results  Component Value Date   CHOL 200 05/14/2021   TRIG 75 05/14/2021   HDL 64 05/14/2021   CHOLHDL 3.1 05/14/2021   VLDL 15 05/14/2021   LDLCALC 121 (H) 05/14/2021   LDLCALC 62 11/17/2017    Physical Findings:  Musculoskeletal: Strength & Muscle Tone: within normal limits Gait & Station: normal Patient leans: N/A  Psychiatric Specialty Exam:  Presentation  General Appearance: Appropriate for Environment; Casual; Well Groomed  Eye Contact:Fair  Speech:Clear and Coherent; Normal Rate  Speech Volume:Normal  Handedness:Right   Mood and Affect  Mood: described as improving - appears more euthymic  Affect:calm, polite, brighter appearing   Thought Process  Thought Processes:Coherent; Goal Directed  Descriptions of Associations:Intact  Orientation:Full (Time, Place and Person)  Thought Content:Logical; WDL - denies AVH, paranoia or delusions  History of Schizophrenia/Schizoaffective disorder:No data recorded Duration of Psychotic Symptoms:No data recorded Hallucinations:Hallucinations: None  Ideas of Reference:None  Suicidal Thoughts:Denied  Homicidal Thoughts:Homicidal Thoughts: No  Sensorium  Memory:Immediate Good; Recent Good  Judgment:Fair  Insight:Fair   Executive Functions  Concentration:Good  Attention Span:Good  Recall:Good  Fund of Knowledge:Good  Language:Good   Psychomotor Activity  Psychomotor Activity:Psychomotor Activity: Normal   Assets  Assets:Communication Skills; Desire for Improvement; Resilience; Physical Health   Sleep  Sleep:Sleep: Fair Number of Hours of Sleep: 4.5   Physical Exam Vitals reviewed.  HENT:     Head: Normocephalic.  Pulmonary:     Effort: Pulmonary effort is normal.  Neurological:     General: No focal deficit present.     Mental Status: She is alert.   Review of Systems  Respiratory:  Negative for cough and shortness of breath.   Cardiovascular:  Negative for chest  pain.  Gastrointestinal:  Positive for diarrhea (single episode) and nausea. Negative for abdominal pain, constipation and vomiting.  Neurological:  Negative for weakness and headaches.  Psychiatric/Behavioral:  Positive for depression (mild). Negative  for hallucinations and suicidal ideas. The patient is nervous/anxious (mild).   Blood pressure (!) 125/97, pulse 98, temperature 99 F (37.2 C), temperature source Oral, resp. rate 16, height  (1.626 m), weight 73.5 kg, last menstrual period 05/07/2021, SpO2 100 %. Body mass index is 27.81 kg/m.   Treatment Plan Summary: Daily contact with patient to assess and evaluate symptoms and progress in treatment and Medication management  Kim Lloyd is a 30 yr old female who presents with SI with plan to OD or shoot herself with a gun.  PPHx is significant for MDD, Recurrent, Severe, GAD, and ADHD, with 2 prior hospitalizations and 1 previous suicide attempt (OD).  Patient appears to be doing well.  She states she does not have any SI today.  She did report an episode of some stomach cramping and needing to go to the bathroom around midnight last night.  As she has a history of this we will not stop the Zoloft at this time and will continue to monitor.  We will continue at this current dose and see if this problem resolves on its own.  May need to consider switching medications if it persists.  As she is having some nausea I will order her a one-time dose of Zofran.  We will continue to monitor. Discussed with patient and she requests to start tapering off Lamictal due to belief the medication has not been beneficial. Since this is an antiepileptic, we discussed need to slowly taper as an outpatient off the medication to avoid potential reduced seizure threshold with abrupt discontinuation.   MDD, Recurrent, Severe, w/out Psychosis PTSD by hx: -Continue Wellbutrin XL 300 mg QHS -Reduce Lamictal to 100 mg QHS -Continue Zoloft 25 mg QHS -Continue  Buspar 10 mg BID -Continue Clonidine 0.3mg  qhs   Nausea: -One dose Zofran ODT 4 mg  Admission Labs reviewed which were pending:  UPT pending; TSH 0.863; Lipid panel WNL except for LDL 121; A1c 4.6; CMP WNL except for AST 14 and total bili 1.6, CBC WNL  -Continue Pepcid 40 mg QHS and Protonix  daily and discuss with patient if she needs both home meds -Continue Amthia daily   -Continue PRN's: Tylenol, Maalox, Atarax, Milk of Magnesia, Trazodone   Lauro Franklin, MD 05/15/2021, 11:19 AM  I have independently evaluated the patient during a face-to-face assessment on 05/15/21. I reviewed the patient's chart, and I participated in key portions of the service. I discussed the case with the [employer], and I agree with the assessment and plan of care as documented in the House Officer's note.   I personally spent 25 minutes on the unit in direct patient care. The direct patient care time included face-to-face time with the patient, reviewing the patient's chart, communicating with other professionals, and coordinating care. Greater than 50% of this time was spent in counseling or coordinating care with the patient regarding goals of hospitalization, psycho-education, and discharge planning needs.  On my assessment patient reports stable and improved mood. She denies SI, HI or AVH. She did have some GI sx last night which have resolved and wishes to continue Zoloft at this time. She reports some residual anxiety as she thinks about psychosocial stressors but does not wish to increase Buspar or try Neurontin. She is interested in tapering off Lamictal due to perceived lack of effectiveness of medication and will therefore reduce dose to  tonight for gradual taper that can be completed outpatient.  Bartholomew Crews, MD, [facility]

## 2021-05-15 NOTE — Progress Notes (Signed)
[facility] Group Notes:  (Nursing/MHT/Case Management/Adjunct)  Date:  05/15/2021  Time:  2015  Type of Therapy:   wrap up group  Participation Level:  Active  Participation Quality:  Appropriate, Attentive, Sharing, and Supportive  Affect:  Depressed  Cognitive:  Alert  Insight:  Improving  Engagement in Group:  Engaged  Modes of Intervention:  Clarification, Education, and Support  Summary of Progress/Problems:Positive thinking and positive change were discussed.   Johann Capers S 05/15/2021, 10:09 PM

## 2021-05-15 NOTE — Progress Notes (Signed)
  Pt presents with moderate anxiety and low depression on assessment.  Pt reports, "decreased anxious and racing thoughts."  Pt stated, "goal today was to work on their anxiety, so they read a book."  Pt denies SI/HI and verbally contracts for safety.  Pt denies AVH.  Medication administered.  Pt remains safe on unit with Q 15 minute safety checks.    05/15/21 2142  Psych Admission Type (Psych Patients Only)  Admission Status Voluntary  Psychosocial Assessment  Patient Complaints Anxiety;Depression  Eye Contact Fair  Facial Expression Anxious  Affect Anxious  Speech Logical/coherent  Interaction Assertive  Motor Activity Other (Comment) (wnl)  Appearance/Hygiene Unremarkable  Behavior Characteristics Cooperative  Mood Pleasant;Anxious  Thought Process  Coherency Circumstantial  Content WDL  Delusions WDL  Perception WDL  Hallucination None reported or observed  Judgment Impaired  Confusion WDL  Danger to Self  Current suicidal ideation? Denies  Self-Injurious Behavior No self-injurious ideation or behavior indicators observed or expressed   Agreement Not to Harm Self Yes  Description of Agreement verbal contract for safety  Danger to Others  Danger to Others None reported or observed

## 2021-05-15 NOTE — Progress Notes (Signed)
D:  Kim Lloyd reported she was tired most of the day.  She attributes this to being admitted earlier this morning.  She continues to report passive SI.  No plan or intent.  She will seek out staff if that changes.  She denied HI or AVH.  Boyfriend brought in her BCP and she was given two pills to make up for missing 05/13/21 evening dose.  She denied any pain or discomfort and appears to be in no physical distress.   A:  1:1 with RN for support and encouragement.  Medications given as ordered.  Q 15 minute checks maintained for safety.  Encouraged participation in group and unit activities.   R:  She is currently resting with her eyes closed and appears to be asleep.  She remains safe on the unit.  We will continue to monitor the progress towards her goals.   05/14/21 2200  Psych Admission Type (Psych Patients Only)  Admission Status Voluntary  Psychosocial Assessment  Patient Complaints Anxiety;Self-harm thoughts  Eye Contact Fair  Facial Expression Sad  Affect Sad  Speech Logical/coherent  Interaction Assertive  Motor Activity Other (Comment) (unremarkable)  Appearance/Hygiene Unremarkable  Behavior Characteristics Anxious  Mood Depressed;Anxious  Thought Process  Coherency Circumstantial  Content WDL  Delusions WDL  Perception WDL  Hallucination None reported or observed  Judgment Impaired  Confusion WDL  Danger to Self  Current suicidal ideation? Passive  Self-Injurious Behavior Some self-injurious ideation observed or expressed.  No lethal plan expressed   Agreement Not to Harm Self Yes  Description of Agreement verbal contract for safety  Danger to Others  Danger to Others None reported or observed

## 2021-05-15 NOTE — Progress Notes (Signed)
   05/15/21 1706  Vital Signs  Temp 99.9 F (37.7 C)  Temp Source Oral  Pulse Rate 90  Pulse Rate Source Monitor  Resp 16  BP 119/73  BP Location Right Arm  BP Method Automatic  Patient Position (if appropriate) Sitting   D: Patient denies SI/HI/AVH. Pt rated anxiety 5/10 and depression "good today". A:  Patient took scheduled medicine.  Support and encouragement provided Routine safety checks conducted every 15 minutes. Patient  Informed to notify staff with any concerns.   R:  Safety maintained.

## 2021-05-16 LAB — PREGNANCY, URINE: Preg Test, Ur: NEGATIVE

## 2021-05-16 NOTE — [facility] Group Notes (Signed)
Pt attended Relaxation Group.  

## 2021-05-16 NOTE — Progress Notes (Signed)
   05/16/21 0905  Psych Admission Type (Psych Patients Only)  Admission Status Voluntary  Psychosocial Assessment  Patient Complaints Anxiety  Eye Contact Fair  Facial Expression Anxious  Affect Anxious  Speech Logical/coherent  Interaction Assertive  Motor Activity Other (Comment) (wnl)  Appearance/Hygiene Unremarkable  Behavior Characteristics Cooperative  Mood Other (Comment) ("Good")  Thought Process  Coherency Circumstantial  Content WDL  Delusions WDL  Perception WDL  Hallucination None reported or observed  Judgment Impaired  Confusion WDL  Danger to Self  Current suicidal ideation? Denies  Self-Injurious Behavior No self-injurious ideation or behavior indicators observed or expressed   Agreement Not to Harm Self Yes  Description of Agreement verbal contract for safety  Danger to Others  Danger to Others None reported or observed   D: Pt alert and oriented. Rates anxiety 4/10 and depression 1/10 at this time. Pt denies experiencing any pain at this time. Pt denies experiencing any SI/HI, or AVH at this time.     A: Scheduled medications administered to pt, per MD orders. Support and encouragement provided. Frequent verbal contact made. Routine safety checks conducted q15 minutes.   R: No adverse drug reactions noted. Pt verbally contracts for safety at this time. Pt complaint with medications. Pt interacts appropriately with others on the unit. Pt remains safe at this time. Will continue to monitor.

## 2021-05-16 NOTE — Progress Notes (Addendum)
[facility] MD Progress Note  05/16/2021 2:51 PM Kim Lloyd  MRN:  [id number] Subjective:  [name] is a 30 yr old female who presents with SI with a plan to OD or shoot herself with a gun.  PPHx is significant for MDD, Recurrent, Severe, GAD, and ADHD, with 2 prior hospitalizations and 1 previous suicide attempt (OD).  Case was discussed in the multidisciplinary team. MAR was reviewed and patient was compliant with medications. She received Tylenol and Atarax yesterday.  Psychiatric Team made the following recommendations yesterday: -Continue Wellbutrin XL 300 mg QHS -Continue Lamictal 150 mg QHS -Start Zoloft 25 mg QHS -Decrease Buspar to 10 mg BID  On interview today patient reports that she is doing good today.  She reports sleeping well last night.  She reports that she is eating and drinking well.  She reports no SI, HI, or AVH.  She reports that she did not have an episode of stomach cramping or the need to use the bathroom (BM) last night.  She reports that she continues to be free of SI.  We will continue the taper off Lamictal that was decided on after discussion with Dr. Mason Jim yesterday afternoon.  Discussed with her that Lamictal needs to be slowly tapered off at a pace no faster than 50 mg every 1-2 weeks to avoid risk of SJS and so her outpatient provider will need to continue with the tapering.  When discussing what the next steps were she states that her boyfriend reports that her brother is packing up his things to move out.  She reports that this was one of the significant psychosocial stressors that led to her hospitalization.  She states that her boyfriend will be visiting her tonight during visitation with updated news about her brother leaving.  Discussed that we would arrange the discharge planning once she knew the timeframe of her brother being out.  She is agreeable to this.    Asked her about being on both Protonix and Pepcid.  She reports that she has a history  of significant acid reflux and that her PCP initially treated her with omeprazole, however, this is not successful in controlling her symptoms.  She states that after this failed trial her PCP advised her to take both medications and she reports that this has been successful in controlling her acid reflux.  Discussed that we will not make any changes at this time to it.    She reports that she did have a fairly significant headache last night that was not completely controlled by Tylenol, however, she attributed this to the hydration as she picked up her water intake and it has since resolved.  She reports no other concerns at present.  Principal Problem: MDD (major depressive disorder), recurrent severe, without psychosis (HCC) Diagnosis: Principal Problem:   MDD (major depressive disorder), recurrent severe, without psychosis (HCC) Active Problems:   PTSD (post-traumatic stress disorder)  Total Time spent with patient: 30 minutes  Past Psychiatric History: MDD, Recurrent, Severe, GAD, and ADHD, with 2 prior hospitalizations and 1 previous suicide attempt (OD)  Past Medical History:  Past Medical History:  Diagnosis Date   Allergy    Anxiety    Bipolar 1 disorder (HCC)    Depression    Eating disorder    remission   Gall bladder disease    gallbladder sluge which led to jaundice   GERD (gastroesophageal reflux disease)    History of chicken pox    age 33  Migraine headache without aura    Overdose drug    History reviewed. No pertinent surgical history. Family History:  Family History  Problem Relation Age of Onset   Mental illness Mother    Alcohol abuse Mother    Mental illness Father    Mental illness Brother    Alcohol abuse Maternal Aunt    Drug abuse Maternal Aunt    Hypothyroidism Maternal Grandmother    Coronary artery disease Maternal Grandfather    Family Psychiatric  History: Reports None Social History:  Social History   Substance and Sexual Activity   Alcohol Use Yes   Alcohol/week: 2.0 standard drinks   Types: 2 Cans of beer per week     Social History   Substance and Sexual Activity  Drug Use No    Social History   Socioeconomic History   Marital status: Single    Spouse name: Not on file   Number of children: Not on file   Years of education: Not on file   Highest education level: Not on file  Occupational History   Not on file  Tobacco Use   Smoking status: Never   Smokeless tobacco: Never  Vaping Use   Vaping Use: Never used  Substance and Sexual Activity   Alcohol use: Yes    Alcohol/week: 2.0 standard drinks    Types: 2 Cans of beer per week   Drug use: No   Sexual activity: Yes    Birth control/protection: Pill  Other Topics Concern   Not on file  Social History Narrative   Not on file   Social Determinants of Health   Financial Resource Strain: Not on file  Food Insecurity: Not on file  Transportation Needs: Not on file  Physical Activity: Not on file  Stress: Not on file  Social Connections: Not on file   Sleep: Good  Appetite:  Good  Current Medications: Current Facility-Administered Medications  Medication Dose Route Frequency Provider Last Rate Last Admin   acetaminophen (TYLENOL) tablet 650 mg  650 mg Oral Q6H PRN Nwoko, Uchenna E, PA   650 mg at 05/16/21 0951   alum & mag hydroxide-simeth (MAALOX/MYLANTA) 200-200-20 MG/5ML suspension 30 mL  30 mL Oral Q4H PRN Nwoko, Uchenna E, PA       buPROPion (WELLBUTRIN XL) 24 hr tablet 300 mg  300 mg Oral QHS Nwoko, Uchenna E, PA   300 mg at 05/15/21 2142   busPIRone (BUSPAR) tablet 10 mg  10 mg Oral BID Lauro Franklin, MD   10 mg at 05/16/21 0931   cloNIDine (CATAPRES) tablet 0.3 mg  0.3 mg Oral QHS Nwoko, Uchenna E, PA   0.3 mg at 05/15/21 2143   famotidine (PEPCID) tablet 40 mg  40 mg Oral QHS Nwoko, Uchenna E, PA   40 mg at 05/15/21 2142   hydrOXYzine (ATARAX/VISTARIL) tablet 25 mg  25 mg Oral TID PRN Nwoko, Uchenna E, PA   25 mg at  05/14/21 1242   lamoTRIgine (LAMICTAL) tablet 100 mg  100 mg Oral QHS Evah Rashid E, MD   100 mg at 05/15/21 2146   Levonorgestrel-Ethinyl Estradiol (AMETHIA) 0.15-0.03 &0.01 MG tablet 1 tablet  1 tablet Oral Daily Nwoko, Uchenna E, PA   1 tablet at 05/15/21 2143   magnesium hydroxide (MILK OF MAGNESIA) suspension 30 mL  30 mL Oral Daily PRN Nwoko, Uchenna E, PA       ondansetron (ZOFRAN-ODT) disintegrating tablet 4 mg  4 mg Oral Once Lauro Franklin,  MD       pantoprazole (PROTONIX) EC tablet 40 mg  40 mg Oral Daily Nwoko, Uchenna E, PA   40 mg at 05/16/21 0931   sertraline (ZOLOFT) tablet 25 mg  25 mg Oral QHS Lauro Franklin, MD   25 mg at 05/15/21 2142   traZODone (DESYREL) tablet 50 mg  50 mg Oral QHS PRN Meta Hatchet, PA        Lab Results:  Results for orders placed or performed during the hospital encounter of 05/14/21 (from the past 48 hour(s))  CBC with Differential/Platelet     Status: None   Collection Time: 05/14/21  6:33 PM  Result Value Ref Range   WBC 6.4 4.0 - 10.5 K/uL   RBC 5.08 3.87 - 5.11 MIL/uL   Hemoglobin 14.1 12.0 - 15.0 g/dL   HCT 12.8 78.6 - 76.7 %   MCV 82.7 80.0 - 100.0 fL   MCH 27.8 26.0 - 34.0 pg   MCHC 33.6 30.0 - 36.0 g/dL   RDW 20.9 47.0 - 96.2 %   Platelets 399 150 - 400 K/uL   nRBC 0.0 0.0 - 0.2 %   Neutrophils Relative % 53 %   Neutro Abs 3.3 1.7 - 7.7 K/uL   Lymphocytes Relative 41 %   Lymphs Abs 2.6 0.7 - 4.0 K/uL   Monocytes Relative 5 %   Monocytes Absolute 0.3 0.1 - 1.0 K/uL   Eosinophils Relative 1 %   Eosinophils Absolute 0.1 0.0 - 0.5 K/uL   Basophils Relative 0 %   Basophils Absolute 0.0 0.0 - 0.1 K/uL   Immature Granulocytes 0 %   Abs Immature Granulocytes 0.02 0.00 - 0.07 K/uL    Comment: Performed at [facility], [address] [place], [place] [address]  Comprehensive metabolic panel     Status: Abnormal   Collection Time: 05/14/21  6:33 PM  Result Value Ref Range   Sodium 135 135 - 145  mmol/L   Potassium 4.1 3.5 - 5.1 mmol/L   Chloride 103 98 - 111 mmol/L   CO2 24 22 - 32 mmol/L   Glucose, Bld 95 70 - 99 mg/dL    Comment: Glucose reference range applies only to samples taken after fasting for at least 8 hours.   BUN 8 6 - 20 mg/dL   Creatinine, Ser 9.47 0.44 - 1.00 mg/dL   Calcium 9.6 8.9 - 65.4 mg/dL   Total Protein 8.1 6.5 - 8.1 g/dL   Albumin 4.5 3.5 - 5.0 g/dL   AST 14 (L) 15 - 41 U/L   ALT 16 0 - 44 U/L   Alkaline Phosphatase 70 38 - 126 U/L   Total Bilirubin 1.6 (H) 0.3 - 1.2 mg/dL   GFR, Estimated >65 >03 mL/min    Comment: (NOTE) Calculated using the CKD-EPI Creatinine Equation (2021)    Anion gap 8 5 - 15    Comment: Performed at [facility], [address], [place], [place] [address]  Hemoglobin A1c     Status: Abnormal   Collection Time: 05/14/21  6:33 PM  Result Value Ref Range   Hgb A1c MFr Bld 4.6 (L) 4.8 - 5.6 %    Comment: (NOTE) Pre diabetes:          5.7%-6.4%  Diabetes:              >6.4%  Glycemic control for   <7.0% adults with diabetes    Mean Plasma Glucose 85.32 mg/dL  Comment: Performed at [facility], [address], [place], [place] [address]  Lipid panel     Status: Abnormal   Collection Time: 05/14/21  6:33 PM  Result Value Ref Range   Cholesterol 200 0 - 200 mg/dL   Triglycerides 75 <536 mg/dL   HDL 64 >64 mg/dL   Total CHOL/HDL Ratio 3.1 RATIO   VLDL 15 0 - 40 mg/dL   LDL Cholesterol 403 (H) 0 - 99 mg/dL    Comment:        Total Cholesterol/HDL:CHD Risk Coronary Heart Disease Risk Table                     Men   Women  1/2 Average Risk   3.4   3.3  Average Risk       5.0   4.4  2 X Average Risk   9.6   7.1  3 X Average Risk  23.4   11.0        Use the calculated Patient Ratio above and the CHD Risk Table to determine the patient's CHD Risk.        ATP III CLASSIFICATION (LDL):  <100     mg/dL   Optimal  474-259  mg/dL   Near or Above                    Optimal  130-159  mg/dL    Borderline  563-875  mg/dL   High  >643     mg/dL   Very High Performed at [facility], [address] [place], [place] [address]   TSH     Status: None   Collection Time: 05/14/21  6:33 PM  Result Value Ref Range   TSH 0.863 0.350 - 4.500 uIU/mL    Comment: Performed by a 3rd Generation assay with a functional sensitivity of <=0.01 uIU/mL. Performed at [facility], [address], [place], [place] [address]   Pregnancy, urine     Status: None   Collection Time: 05/15/21  6:32 AM  Result Value Ref Range   Preg Test, Ur NEGATIVE NEGATIVE    Comment: Performed at [facility], [address] [place], [place] [address]    Blood Alcohol level:  Lab Results  Component Value Date   [facility] <11 10/03/2011    Metabolic Disorder Labs: Lab Results  Component Value Date   HGBA1C 4.6 (L) 05/14/2021   MPG 85.32 05/14/2021   No results found for: PROLACTIN Lab Results  Component Value Date   CHOL 200 05/14/2021   TRIG 75 05/14/2021   HDL 64 05/14/2021   CHOLHDL 3.1 05/14/2021   VLDL 15 05/14/2021   LDLCALC 121 (H) 05/14/2021   LDLCALC 62 11/17/2017    Physical Findings:  Musculoskeletal: Strength & Muscle Tone: within normal limits Gait & Station: normal Patient leans: N/A  Psychiatric Specialty Exam:  Presentation  General Appearance: Appropriate for Environment; Casual; Well Groomed  Eye Contact:Good  Speech:Clear and Coherent; Normal Rate  Speech Volume:Normal  Handedness:Right   Mood and Affect  Mood: described as improving - appears more euthymic  Affect:brighter appearing, can smile today   Thought Process  Thought Processes:Coherent; Goal Directed, linear  Descriptions of Associations:Intact  Orientation:Full (Time, Place and Person)  Thought Content:Logical; WDL  - denies AVH, paranoia or delusions  History of Schizophrenia/Schizoaffective disorder:No data recorded Duration of Psychotic  Symptoms:No data recorded Hallucinations:Hallucinations: None  Ideas of Reference:None  Suicidal Thoughts:Denied  Homicidal Thoughts:Homicidal Thoughts: No  Sensorium  Memory:Immediate Good; Recent Good  Judgment:Improved  Insight:Intact   Executive Functions  Concentration:Good  Attention Span:Good  Recall:Good  Fund of Knowledge:Good  Language:Good   Psychomotor Activity  Psychomotor Activity:Psychomotor Activity: Normal   Assets  Assets:Communication Skills; Desire for Improvement; Resilience; Physical Health   Sleep  Sleep:Sleep: Good Number of Hours of Sleep: 6.5   Physical Exam Vitals reviewed.  HENT:     Head: Normocephalic.  Pulmonary:     Effort: Pulmonary effort is normal.  Neurological:     General: No focal deficit present.     Mental Status: She is alert.   Review of Systems  Respiratory:  Negative for cough and shortness of breath.   Cardiovascular:  Negative for chest pain.  Gastrointestinal:  Negative for abdominal pain, constipation, diarrhea (single episode), nausea and vomiting.  Neurological:  Negative for weakness and headaches.  Psychiatric/Behavioral:  Negative for hallucinations and suicidal ideas. Depression: mild.Nervous/anxious: mild.   Blood pressure (!) 123/95, pulse 92, temperature 99.9 F (37.7 C), temperature source Oral, resp. rate 16, height  (1.626 m), weight 73.5 kg, last menstrual period 05/07/2021, SpO2 100 %. Body mass index is 27.81 kg/m.   Treatment Plan Summary: Daily contact with patient to assess and evaluate symptoms and progress in treatment and Medication management  [name] is a 30 yr old female who presents with SI with plan to OD or shoot herself with a gun.  PPHx is significant for MDD, Recurrent, Severe, GAD, and ADHD, with 2 prior hospitalizations and 1 previous suicide attempt (OD).  Patient continues to do well.  It does seem like her stomach troubles the other night were due to her  longstanding issues and not because of starting Zoloft so we will continue with it.  We decreased her Lamictal and her outpatient provider will continue to taper it.  Once that timeframe of her brother leaving is known we will begin to plan discharge.  We will not make any medication changes at this time.  We will continue to monitor.  MDD, Recurrent, Severe, w/out Psychosis  PTSD by hx: -Continue Wellbutrin XL 300 mg QHS -Continue Lamictal to 100 mg QHS with plans to taper off as an outpatient -Continue Zoloft 25 mg QHS -Continue Buspar 10 mg BID -Continue Clonidine 0.3mg  qhs   Nausea(resolved)/ GERD: -Continue Pepcid 40 mg QHS -Continue Protonix  daily - Discussed elevated total bili on admission labs and need to f/u with PCP after discharge  -Continue Amthia daily -Continue PRN's: Tylenol, Maalox, Atarax, Milk of Magnesia, Trazodone   Lauro Franklin, MD 05/16/2021, 2:51 PM

## 2021-05-16 NOTE — Group Note (Addendum)
LCSW Group Therapy Note   Group Date: 05/16/2021 Start Time: 1300 End Time: 1400   Type of Therapy and Topic:  Group Therapy:   Participation Level:  Active  Topic: Coping Skills  Due to the acuity and complex discharge plans, group was not held. Patient was provided therapeutic worksheets and asked to meet with CSW as needed.  Felizardo Hoffmann, LCSWA 05/16/2021  2:48 PM

## 2021-05-16 NOTE — B[facility] Group Notes (Signed)
Patient engaged in Psycho-ed group by participating in the music and singing his goals out. Educated patient on ways to practice self-care.

## 2021-05-16 NOTE — B[facility] Group Notes (Signed)
The focus of this group is to help patients establish daily goals to achieve during treatment and discuss how the patient can incorporate goal setting into their daily lives to aide in recovery.  Pt attended morning goals group, Pt stated her goal for today was to working on her coping skills

## 2021-05-17 MED ORDER — LEVONORGEST-ETH ESTRAD 91-DAY 0.15-0.03 &0.01 MG PO TABS
1.0000 | ORAL_TABLET | Freq: Every day | ORAL | Status: DC
  Administered 2021-05-18: 1 via ORAL

## 2021-05-17 NOTE — B[facility] Group Notes (Signed)
Psychoeducational Group Note  Date: 05-17-21 Time:  1300  Group Topic/Focus:  Making Healthy Choices:   The focus of this group is to help patients identify negative/unhealthy choices they were using prior to admission and identify positive/healthier coping strategies to replace them upon discharge.In this group.   Participation Level:  Active  Participation Quality:  Appropriate  Affect:  Appropriate  Cognitive:  Oriented  Insight:  Improving  Engagement in Group:  Engaged  Additional Comments: rates her energy at an 8/10. Participated fully  Dione Housekeeper

## 2021-05-17 NOTE — B[facility] Group Notes (Signed)
.  Psychoeducational Group Note  Date: 05/15/2021 Time: 0900-1000    Goal Setting   Purpose of Group: This group helps to provide patients with the steps of setting a goal that is specific, measurable, attainable, realistic and time specific. A discussion on how we keep ourselves stuck with negative self talk.    Participation Level:  Did not attend   Dione Housekeeper

## 2021-05-17 NOTE — B[facility] Group Notes (Signed)
.  Psychoeducational Group Note    Date:05/17/2021 Time: 1300-1400    Purpose of Group: . The group focus' on teaching patients on how to identify their needs and how Life Skills:  A group where two lists are made. What people need and what are things that we do that are healthy. The lists are developed by the patients and it is explained that we often do the actions that are not healthy to get our list of needs met.  to develop the coping skills needed to get their needs met  Participation Level:  Active  Participation Quality:  Appropriate  Affect:  Appropriate  Cognitive:  Oriented  Insight:  Improving  Engagement in Group:  Engaged  Additional Comments:  Rates her energy at a 8/10. Appropriate throughout the group.  Paulino Rily

## 2021-05-17 NOTE — Group Note (Signed)
LCSW Group Therapy Note  Group Date: 05/17/2021 Start Time: 1005 End Time: 1105   Type of Therapy and Topic:  Group Therapy: Anger Cues and Responses  Participation Level:  Active   Description of Group:   In this group, patients learned how to recognize the physical, cognitive, emotional, and behavioral responses they have to anger-provoking situations.  They identified a recent time they became angry and how they reacted.  They analyzed how their reaction was possibly beneficial and how it was possibly unhelpful.  The group discussed a variety of healthier coping skills that could help with such a situation in the future.  Focus was placed on how helpful it is to recognize the underlying emotions to our anger, because working on those can lead to a more permanent solution as well as our ability to focus on the important rather than the urgent.  Therapeutic Goals: Patients will remember their last incident of anger and how they felt emotionally and physically, what their thoughts were at the time, and how they behaved. Patients will identify how their behavior at that time worked for them, as well as how it worked against them. Patients will explore possible new behaviors to use in future anger situations. Patients will learn that anger itself is normal and cannot be eliminated, and that healthier reactions can assist with resolving conflict rather than worsening situations.  Summary of Patient Progress:  Kim Lloyd was active during the group. She shared a recent occurrence wherein feeling intense stress led to anger which led to her admission here.  She mostly listened, but did contribute to the discussion a time or two. She demonstrated appropriate insight into the subject matter, was respectful of peers, and participated throughout the entire session.  Therapeutic Modalities:   Cognitive Behavioral Therapy    Lynnell Chad, LCSWA 05/17/2021  11:22 AM
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
## 2021-05-17 NOTE — Progress Notes (Signed)
[facility] MD Progress Note  05/17/2021 2:01 PM Lyndall NOVIS LEAGUE  MRN:  [id number] Subjective: "  I am doing much better.   I can go home tomorrow or Monday" Reason for admission:  Miss. Beckom is a 30 yr old female who presents with SI with a plan to OD or shoot herself with a gun.  PPHx is significant for MDD, Recurrent, Severe, GAD, and ADHD, with 2 prior hospitalizations and 1 previous suicide attempt (OD). Today's assessment:   Note reviewed, report received and care reviewed with members of our interdisciplinary team. Patient was seen in the office this afternoon for daily evaluation.  She was pleasant and engaged in our conversation.  She is happy that her diagnosis is being corrected as she is being weaned off Lamictal, mood stabilizer.  She reported good sleep and better appetite.  She also is participating in various group activities.  She denied side effects of her medications and she also understands that Lamictal will be gradually weaned off.  Patient denied SI/HI/AVH. Is encouraged to continue taking part in group activities.  She is going to be discharged on Bupropion, low dose Sertraline and Lamictal.  Outpatient Psychiatrist will determined how to wean her off Lamictal. Principal Problem: MDD (major depressive disorder), recurrent severe, without psychosis (HCC) Diagnosis: Principal Problem:   MDD (major depressive disorder), recurrent severe, without psychosis (HCC) Active Problems:   PTSD (post-traumatic stress disorder)  Total Time spent with patient:  25 minutes  Past Psychiatric History: MDD, Recurrent, Severe, GAD, and ADHD, with 2 prior hospitalizations and 1 previous suicide attempt (OD)  Past Medical History:  Past Medical History:  Diagnosis Date   Allergy    Anxiety    Bipolar 1 disorder (HCC)    Depression    Eating disorder    remission   Gall bladder disease    gallbladder sluge which led to jaundice   GERD (gastroesophageal reflux disease)    History of chicken  pox    age 68   Migraine headache without aura    Overdose drug    History reviewed. No pertinent surgical history. Family History:  Family History  Problem Relation Age of Onset   Mental illness Mother    Alcohol abuse Mother    Mental illness Father    Mental illness Brother    Alcohol abuse Maternal Aunt    Drug abuse Maternal Aunt    Hypothyroidism Maternal Grandmother    Coronary artery disease Maternal Grandfather    Family Psychiatric  History: Reports None Social History:  Social History   Substance and Sexual Activity  Alcohol Use Yes   Alcohol/week: 2.0 standard drinks   Types: 2 Cans of beer per week     Social History   Substance and Sexual Activity  Drug Use No    Social History   Socioeconomic History   Marital status: Single    Spouse name: Not on file   Number of children: Not on file   Years of education: Not on file   Highest education level: Not on file  Occupational History   Not on file  Tobacco Use   Smoking status: Never   Smokeless tobacco: Never  Vaping Use   Vaping Use: Never used  Substance and Sexual Activity   Alcohol use: Yes    Alcohol/week: 2.0 standard drinks    Types: 2 Cans of beer per week   Drug use: No   Sexual activity: Yes    Birth control/protection: Pill  Other Topics Concern   Not on file  Social History Narrative   Not on file   Social Determinants of Health   Financial Resource Strain: Not on file  Food Insecurity: Not on file  Transportation Needs: Not on file  Physical Activity: Not on file  Stress: Not on file  Social Connections: Not on file   Sleep: Good  Appetite:  Good  Current Medications: Current Facility-Administered Medications  Medication Dose Route Frequency Provider Last Rate Last Admin   acetaminophen (TYLENOL) tablet 650 mg  650 mg Oral Q6H PRN Nwoko, Uchenna E, PA   650 mg at 05/17/21 1112   alum & mag hydroxide-simeth (MAALOX/MYLANTA) 200-200-20 MG/5ML suspension 30 mL  30 mL Oral  Q4H PRN Nwoko, Uchenna E, PA       buPROPion (WELLBUTRIN XL) 24 hr tablet 300 mg  300 mg Oral QHS Nwoko, Uchenna E, PA   300 mg at 05/16/21 2123   busPIRone (BUSPAR) tablet 10 mg  10 mg Oral BID Lauro Franklin, MD   10 mg at 05/17/21 0901   cloNIDine (CATAPRES) tablet 0.3 mg  0.3 mg Oral QHS Nwoko, Uchenna E, PA   0.3 mg at 05/16/21 2123   famotidine (PEPCID) tablet 40 mg  40 mg Oral QHS Nwoko, Uchenna E, PA   40 mg at 05/16/21 2123   hydrOXYzine (ATARAX/VISTARIL) tablet 25 mg  25 mg Oral TID PRN Nwoko, Uchenna E, PA   25 mg at 05/14/21 1242   lamoTRIgine (LAMICTAL) tablet 100 mg  100 mg Oral QHS Mason Jim, Amy E, MD   100 mg at 05/16/21 2124   Levonorgestrel-Ethinyl Estradiol (AMETHIA) 0.15-0.03 &0.01 MG tablet 1 tablet  1 tablet Oral QHS Singleton, Amy E, MD       magnesium hydroxide (MILK OF MAGNESIA) suspension 30 mL  30 mL Oral Daily PRN Nwoko, Uchenna E, PA       ondansetron (ZOFRAN-ODT) disintegrating tablet 4 mg  4 mg Oral Once Lauro Franklin, MD       pantoprazole (PROTONIX) EC tablet 40 mg  40 mg Oral Daily Nwoko, Uchenna E, PA   40 mg at 05/17/21 0901   sertraline (ZOLOFT) tablet 25 mg  25 mg Oral QHS Lauro Franklin, MD   25 mg at 05/16/21 2124   traZODone (DESYREL) tablet 50 mg  50 mg Oral QHS PRN Nwoko, Uchenna E, PA        Lab Results:  No results found for this or any previous visit (from the past 48 hour(s)).   Blood Alcohol level:  Lab Results  Component Value Date   [facility] <11 10/03/2011    Metabolic Disorder Labs: Lab Results  Component Value Date   HGBA1C 4.6 (L) 05/14/2021   MPG 85.32 05/14/2021   No results found for: PROLACTIN Lab Results  Component Value Date   CHOL 200 05/14/2021   TRIG 75 05/14/2021   HDL 64 05/14/2021   CHOLHDL 3.1 05/14/2021   VLDL 15 05/14/2021   LDLCALC 121 (H) 05/14/2021   LDLCALC 62 11/17/2017    Physical Findings:  Musculoskeletal: Strength & Muscle Tone: within normal limits Gait & Station:  normal Patient leans: N/A  Psychiatric Specialty Exam:  Presentation  General Appearance: Appropriate for Environment; Fairly Groomed; Neat  Eye Contact:Good  Speech:Clear and Coherent; Normal Rate  Speech Volume:Normal  Handedness:Right   Mood and Affect  Mood: described as improving - appears more euthymic  Affect:brighter appearing, can smile today   Thought Process  Thought  Processes:Coherent; Goal Directed , linear  Descriptions of Associations:Intact  Orientation:Full (Time, Place and Person)  Thought Content:Logical  - denies AVH, paranoia or delusions  History of Schizophrenia/Schizoaffective disorder:No data recorded Duration of Psychotic Symptoms:No data recorded Hallucinations:Hallucinations: None  Ideas of Reference:None  Suicidal Thoughts:Denied  Homicidal Thoughts:Homicidal Thoughts: No  Sensorium  Memory:Immediate Good; Recent Good  Judgment:Improved  Insight:Intact   Executive Functions  Concentration:Good  Attention Span:Good  Recall:Good  Fund of Knowledge:Good  Language:Good   Psychomotor Activity  Psychomotor Activity:Psychomotor Activity: Normal Assets  Assets:Communication Skills; Desire for Improvement; Housing; Physical Health; Resilience   Sleep  Sleep:Sleep: Good Number of Hours of Sleep: 6.75 Physical Exam Vitals reviewed.  HENT:     Head: Normocephalic.  Pulmonary:     Effort: Pulmonary effort is normal.  Neurological:     General: No focal deficit present.     Mental Status: She is alert.   Review of Systems  Respiratory:  Negative for cough and shortness of breath.   Cardiovascular:  Negative for chest pain.  Gastrointestinal:  Negative for abdominal pain, constipation, diarrhea (single episode), nausea and vomiting.  Neurological:  Negative for weakness and headaches.  Psychiatric/Behavioral:  Negative for hallucinations and suicidal ideas. Depression: mild.Nervous/anxious: mild.   Blood pressure  107/70, pulse 89, temperature 98.7 F (37.1 C), resp. rate 16, height 5\' 4"  (1.626 m), weight 73.5 kg, last menstrual period 05/07/2021, SpO2 100 %. Body mass index is 27.81 kg/m. Treatment Plan Summary: Daily contact with patient to assess and evaluate symptoms and progress in treatment and Medication management MDD, Recurrent, Severe, w/out Psychosis  PTSD by hx: -Continue Wellbutrin XL 300 mg QHS -Continue Lamictal to 100 mg QHS with plans to taper off as an outpatient -Continue Zoloft 25 mg QHS -Continue Buspar 10 mg BID -Continue Clonidine 0.3mg  qhs Nausea(resolved)/ GERD: -Continue Pepcid 40 mg QHS -Continue Protonix 40mg  daily - Discussed elevated total bili on admission labs and need to f/u with PCP after discharge  -Continue Amthia daily -Continue PRN's: Tylenol, Maalox, Atarax, Milk of Magnesia, Trazodone   05/09/2021, NP-PMHNP-BC 05/17/2021, 2:01 PM

## 2021-05-17 NOTE — Progress Notes (Signed)
[facility] Group Notes:  (Nursing/MHT/Case Management/Adjunct)  Date:  05/17/2021  Time:  12:56 AM  Type of Therapy:   AA  Participation Level:  Active  Participation Quality:  Appropriate, Sharing, and Supportive  Affect:  Appropriate  Cognitive:  Alert, Appropriate, and Oriented  Insight:  Appropriate and Good  Engagement in Group:  Engaged and Supportive  Modes of Intervention:  Discussion and Support  Summary of Progress/Problems: Pt interacted and participated appropriately in group when prompted by the group facilitators.  Kim Lloyd 05/17/2021, 12:56 AM

## 2021-05-17 NOTE — Progress Notes (Signed)
D: Patient is pleasant; she is interacting well with her peers. She rates her depression as a 2; hopelessness as a 3; and anxiety as an 8. Her goal is to "work on going home." She denies any thoughts of self harm. She is compliant with her medications.  A: Continue to monitor medication management and MD orders.  Safety checks completed every 15 minutes per protocol.  Offer support and encouragement as needed.  R: Patient is receptive to staff; her behavior is appropriate.     05/17/21 1100  Psych Admission Type (Psych Patients Only)  Admission Status Voluntary  Psychosocial Assessment  Patient Complaints Anxiety  Eye Contact Fair  Facial Expression Anxious  Affect Anxious  Speech Logical/coherent  Interaction Assertive  Motor Activity Other (Comment) (wnl)  Appearance/Hygiene Unremarkable  Behavior Characteristics Appropriate to situation  Mood Pleasant  Thought Process  Coherency Circumstantial  Content WDL  Delusions None reported or observed  Perception WDL  Hallucination None reported or observed  Judgment Impaired  Confusion WDL  Danger to Self  Current suicidal ideation? Denies  Danger to Others  Danger to Others None reported or observed

## 2021-05-17 NOTE — Progress Notes (Signed)
[facility] Group Notes:  (Nursing/MHT/Case Management/Adjunct)  Date:  05/17/2021  Time:  11:45 PM  Type of Therapy:  Group Therapy  Participation Level:  Active  Participation Quality:  Appropriate, Attentive, Sharing, and Supportive  Affect:  Appropriate  Cognitive:  Alert, Appropriate, and Oriented  Insight:  Appropriate and Good  Engagement in Group:  Engaged and Supportive  Modes of Intervention:  Discussion  Summary of Progress/Problems: Pt informed the group she has "bad coping mechanisms" and wants to continue to work on her coping skills. Pt believes her new medications are working.  Fransico Michael Laverne 05/17/2021, 11:45 PM

## 2021-05-18 NOTE — Progress Notes (Signed)
Pt denies SI/HI/AVH and verbally agrees to approach staff if these become apparent or before harming themselves/others. Rates depression 0/10. Rates anxiety 6/10. Rates pain 0/10.  Pt complains about being tired. Pt has made friends on the unit and went to morning group. Pt was participating in group. Had fun singing with other pts during group doing karaoke. Seen smiling on the unit and made friends with others. Scheduled medications administered to Pt, per MD orders. RN provided support and encouragement to Pt. Q15 min safety checks implemented and continued. Pt safe on the unit. RN will continue to monitor and intervene as needed.   05/18/21 0750  Psych Admission Type (Psych Patients Only)  Admission Status Voluntary  Psychosocial Assessment  Patient Complaints Anxiety;Other (Comment) (tired)  Eye Contact Fair  Facial Expression Anxious  Affect Other (Comment) (WDL)  Speech Logical/coherent  Interaction Assertive  Motor Activity Other (Comment) (WDL)  Appearance/Hygiene Unremarkable  Behavior Characteristics Cooperative;Calm;Appropriate to situation  Mood Pleasant  Aggressive Behavior  Effect No apparent injury  Thought Process  Coherency WDL  Content WDL  Delusions None reported or observed  Perception WDL  Hallucination None reported or observed  Judgment Impaired  Confusion WDL  Danger to Self  Current suicidal ideation? Denies  Self-Injurious Behavior No self-injurious ideation or behavior indicators observed or expressed   Agreement Not to Harm Self Yes  Description of Agreement verbal contract for safety  Danger to Others  Danger to Others None reported or observed

## 2021-05-18 NOTE — [facility] Group Notes (Signed)
participated and contributed to wrap up group 

## 2021-05-18 NOTE — [facility] Group Notes (Signed)
[facility] Group Notes:  (Nursing/MHT/Case Management/Adjunct)  Date:  05/18/2021  Time:  10:41 AM  Type of Therapy:  Group Therapy  Participation Level:  Active  Participation Quality:  Appropriate  Affect:  Appropriate  Cognitive:  Appropriate  Insight:  Appropriate  Engagement in Group:  Engaged  Modes of Intervention:  Discussion  Summary of Progress/Problems: Patient continues to work on her goals and believes she is doing well in that area.   Reymundo Poll 05/18/2021, 10:41 AM

## 2021-05-18 NOTE — Group Note (Signed)
LCSW Group Therapy Note   Group Date: 05/18/2021 Start Time: 1000 End Time: 1100   Type of Therapy and Topic:  Group Therapy: Boundaries  Participation Level:  Active  Description of Group: This group will address the use of boundaries in their personal lives. Patients will explore why boundaries are important, the difference between healthy and unhealthy boundaries, and negative and postive outcomes of different boundaries and will look at how boundaries can be crossed.  Patients will be encouraged to identify current boundaries in their own lives and identify what kind of boundary is being set. Facilitators will guide patients in utilizing problem-solving interventions to address and correct types boundaries being used and to address when no boundary is being used. Understanding and applying boundaries will be explored and addressed for obtaining and maintaining a balanced life. Patients will be encouraged to explore ways to assertively make their boundaries and needs known to significant others in their lives, using other group members and facilitator for role play, support, and feedback.  Therapeutic Goals:  1.  Patient will identify areas in their life where setting clear boundaries could be  used to improve their life.  2.  Patient will identify signs/triggers that a boundary is not being respected. 3.  Patient will identify two ways to set boundaries in order to achieve balance in  their lives: 4.  Patient will demonstrate ability to communicate their needs and set boundaries  through discussion and/or role plays  Summary of Patient Progress:  [place] was fully present/active throughout the session and proved open to feedback from CSW and peers. Patient demonstrated growing insight into the subject matter, was respectful of peers, and was present throughout the entire session.  She commented only one time, but was very attentive to the entire discussion as others talked.  Therapeutic  Modalities:   Cognitive Behavioral Therapy Solution-Focused Therapy  Lynnell Chad, LCSWA 05/18/2021  3:17 PM

## 2021-05-18 NOTE — [facility] Group Notes (Signed)
[facility] Group Notes:  (Nursing/MHT/Case Management/Adjunct)  Date:  05/18/2021  Time:  4:07 PM  Type of Therapy:  Music Therapy  Participation Level:  Active  Participation Quality:  Appropriate  Affect:  Appropriate  Cognitive:  Appropriate  Insight:  Appropriate  Engagement in Group:  Engaged  Modes of Intervention:  Activity  Summary of Progress/Problems: Patient was engaged in group by participating in sing-a-long. Patient said,music gave her a sense of well-being."  Reymundo Poll 05/18/2021, 4:07 PM

## 2021-05-18 NOTE — Progress Notes (Addendum)
[facility] MD Progress Note  05/18/2021 3:45 PM Kylieann ELA MOFFAT  MRN:  [id number] Subjective: "  I am doing much better.   I am going home on Monday" Reason for admission:  Kim Lloyd is a 30 yr old female who presents with SI with a plan to OD or shoot herself with a gun.  PPHx is significant for MDD, Recurrent, Severe, GAD, and ADHD, with 2 prior hospitalizations and 1 previous suicide attempt (OD). Today's assessment:   Note reviewed, report received and care reviewed with members of our interdisciplinary team. Patient was seen in the office this afternoon for daily evaluation.  She is calm, friendly and cooperative.  She is taking her medications as prescribed and eating well.  Patient is visible in the unit and socializes with peers.  She participates in group activities and plans for discharge tomorrow.  She denied SI/HI/AVH and no mention of paranoia. Principal Problem: MDD (major depressive disorder), recurrent severe, without psychosis (HCC) Diagnosis: Principal Problem:   MDD (major depressive disorder), recurrent severe, without psychosis (HCC) Active Problems:   PTSD (post-traumatic stress disorder)  Total Time spent with patient: 15 minutes  Past Psychiatric History: MDD, Recurrent, Severe, GAD, and ADHD, with 2 prior hospitalizations and 1 previous suicide attempt (OD)  Past Medical History:  Past Medical History:  Diagnosis Date   Allergy    Anxiety    Bipolar 1 disorder (HCC)    Depression    Eating disorder    remission   Gall bladder disease    gallbladder sluge which led to jaundice   GERD (gastroesophageal reflux disease)    History of chicken pox    age 83   Migraine headache without aura    Overdose drug    History reviewed. No pertinent surgical history. Family History:  Family History  Problem Relation Age of Onset   Mental illness Mother    Alcohol abuse Mother    Mental illness Father    Mental illness Brother    Alcohol abuse Maternal Aunt    Drug abuse  Maternal Aunt    Hypothyroidism Maternal Grandmother    Coronary artery disease Maternal Grandfather    Family Psychiatric  History: Reports None Social History:  Social History   Substance and Sexual Activity  Alcohol Use Yes   Alcohol/week: 2.0 standard drinks   Types: 2 Cans of beer per week     Social History   Substance and Sexual Activity  Drug Use No    Social History   Socioeconomic History   Marital status: Single    Spouse name: Not on file   Number of children: Not on file   Years of education: Not on file   Highest education level: Not on file  Occupational History   Not on file  Tobacco Use   Smoking status: Never   Smokeless tobacco: Never  Vaping Use   Vaping Use: Never used  Substance and Sexual Activity   Alcohol use: Yes    Alcohol/week: 2.0 standard drinks    Types: 2 Cans of beer per week   Drug use: No   Sexual activity: Yes    Birth control/protection: Pill  Other Topics Concern   Not on file  Social History Narrative   Not on file   Social Determinants of Health   Financial Resource Strain: Not on file  Food Insecurity: Not on file  Transportation Needs: Not on file  Physical Activity: Not on file  Stress: Not on file  Social Connections: Not on file   Sleep: Good  Appetite:  Good  Current Medications: Current Facility-Administered Medications  Medication Dose Route Frequency Provider Last Rate Last Admin   acetaminophen (TYLENOL) tablet 650 mg  650 mg Oral Q6H PRN Nwoko, Uchenna E, PA   650 mg at 05/17/21 1112   alum & mag hydroxide-simeth (MAALOX/MYLANTA) 200-200-20 MG/5ML suspension 30 mL  30 mL Oral Q4H PRN Nwoko, Uchenna E, PA       buPROPion (WELLBUTRIN XL) 24 hr tablet 300 mg  300 mg Oral QHS Nwoko, Uchenna E, PA   300 mg at 05/17/21 2110   busPIRone (BUSPAR) tablet 10 mg  10 mg Oral BID Lauro Franklin, MD   10 mg at 05/18/21 0748   cloNIDine (CATAPRES) tablet 0.3 mg  0.3 mg Oral QHS Nwoko, Uchenna E, PA   0.3 mg at  05/17/21 2110   famotidine (PEPCID) tablet 40 mg  40 mg Oral QHS Nwoko, Uchenna E, PA   40 mg at 05/17/21 2110   hydrOXYzine (ATARAX/VISTARIL) tablet 25 mg  25 mg Oral TID PRN Nwoko, Uchenna E, PA   25 mg at 05/14/21 1242   lamoTRIgine (LAMICTAL) tablet 100 mg  100 mg Oral QHS Mason Jim, Amy E, MD   100 mg at 05/17/21 2110   Levonorgestrel-Ethinyl Estradiol (AMETHIA) 0.15-0.03 &0.01 MG tablet 1 tablet  1 tablet Oral QHS Singleton, Amy E, MD       magnesium hydroxide (MILK OF MAGNESIA) suspension 30 mL  30 mL Oral Daily PRN Nwoko, Uchenna E, PA       ondansetron (ZOFRAN-ODT) disintegrating tablet 4 mg  4 mg Oral Once Lauro Franklin, MD       pantoprazole (PROTONIX) EC tablet 40 mg  40 mg Oral Daily Nwoko, Uchenna E, PA   40 mg at 05/18/21 0748   sertraline (ZOLOFT) tablet 25 mg  25 mg Oral QHS Lauro Franklin, MD   25 mg at 05/17/21 2110   traZODone (DESYREL) tablet 50 mg  50 mg Oral QHS PRN Nwoko, Uchenna E, PA        Lab Results:  No results found for this or any previous visit (from the past 48 hour(s)).   Blood Alcohol level:  Lab Results  Component Value Date   [facility] <11 10/03/2011    Metabolic Disorder Labs: Lab Results  Component Value Date   HGBA1C 4.6 (L) 05/14/2021   MPG 85.32 05/14/2021   No results found for: PROLACTIN Lab Results  Component Value Date   CHOL 200 05/14/2021   TRIG 75 05/14/2021   HDL 64 05/14/2021   CHOLHDL 3.1 05/14/2021   VLDL 15 05/14/2021   LDLCALC 121 (H) 05/14/2021   LDLCALC 62 11/17/2017    Physical Findings:  Musculoskeletal: Strength & Muscle Tone: within normal limits Gait & Station: normal Patient leans: N/A  Psychiatric Specialty Exam:  Presentation  General Appearance: Appropriate for Environment; Casual; Fairly Groomed  Eye Contact:Good  Speech:Clear and Coherent; Normal Rate  Speech Volume:Normal  Handedness:Right   Mood and Affect  Mood: described as improving - appears more  euthymic  Affect:brighter appearing, can smile today   Thought Process  Thought Processes:Coherent; Goal Directed , linear  Descriptions of Associations:Intact  Orientation:Full (Time, Place and Person)  Thought Content:Logical  - denies AVH, paranoia or delusions  History of Schizophrenia/Schizoaffective disorder:No data recorded Duration of Psychotic Symptoms:No data recorded Hallucinations:Hallucinations: None  Ideas of Reference:None  Suicidal Thoughts:Denied  Homicidal Thoughts:Homicidal Thoughts: No  Sensorium  Memory:Immediate Good; Recent Good; Remote Good  Judgment:Improved  Insight:Intact   Executive Functions  Concentration:Good  Attention Span:Good  Recall:Good  Fund of Knowledge:Good  Language:Good   Psychomotor Activity  Psychomotor Activity:Psychomotor Activity: Normal Assets  Assets:Communication Skills; Desire for Improvement; Financial Resources/Insurance; Housing; Resilience; Intimacy; Physical Health   Sleep  Sleep:Sleep: Good Number of Hours of Sleep: 6.5 Physical Exam Vitals reviewed.  HENT:     Head: Normocephalic.  Pulmonary:     Effort: Pulmonary effort is normal.  Neurological:     General: No focal deficit present.     Mental Status: She is alert.   Review of Systems  Respiratory:  Negative for cough and shortness of breath.   Cardiovascular:  Negative for chest pain.  Gastrointestinal:  Negative for abdominal pain, constipation, diarrhea (single episode), nausea and vomiting.  Neurological:  Negative for weakness and headaches.  Psychiatric/Behavioral:  Negative for hallucinations and suicidal ideas. Depression: mild.Nervous/anxious: mild.   Blood pressure 100/74, pulse 81, temperature 98.4 F (36.9 C), resp. rate 16, height 5\' 4"  (1.626 m), weight 73.5 kg, last menstrual period 05/07/2021, SpO2 100 %. Body mass index is 27.81 kg/m. Treatment Plan Summary: Daily contact with patient to assess and evaluate symptoms  and progress in treatment and Medication management MDD, Recurrent, Severe, w/out Psychosis  PTSD by hx: -Continue Wellbutrin XL 300 mg QHS -Continue Lamictal to 100 mg QHS with plans to taper off as an outpatient -Continue Zoloft 25 mg QHS -Continue Buspar 10 mg BID -Continue Clonidine 0.3mg  qhs Nausea(resolved)/ GERD: -Continue Pepcid 40 mg QHS -Continue Protonix 40mg  daily - Discussed elevated total bili on admission labs and need to f/u with PCP after discharge  -Continue Amthia daily -Continue PRN's: Tylenol, Maalox, Atarax, Milk of Magnesia, Trazodone   05/09/2021, NP-PMHNP-BC 05/18/2021, 3:45 PM  I have reviewed the patient's chart and discussed the case with the APP. I agree with the assessment and plan of care as documented in the APP's note.  Earney Navy, MD, 05/20/2021

## 2021-05-19 ENCOUNTER — Encounter (HOSPITAL_COMMUNITY): Payer: Self-pay

## 2021-05-19 MED ORDER — SERTRALINE HCL 25 MG PO TABS: 25.0000 mg | ORAL_TABLET | Freq: Every day | ORAL | 0 refills | Status: DC

## 2021-05-19 MED ORDER — BUSPIRONE HCL 10 MG PO TABS: 10.0000 mg | ORAL_TABLET | Freq: Two times a day (BID) | ORAL | 0 refills | Status: AC

## 2021-05-19 MED ORDER — LAMOTRIGINE 100 MG PO TABS: 100.0000 mg | ORAL_TABLET | Freq: Every day | ORAL | 0 refills | Status: DC

## 2021-05-19 NOTE — Discharge Summary (Addendum)
Physician Discharge Summary Note  Patient:  Kim Lloyd is an 30 y.o., female MRN:  [id number] DOB:  [birth year] Patient phone:  [phone] (home)  Patient address:   [place] [address],  Total Time spent with patient: 45 minutes  Date of Admission:  05/14/2021 Date of Discharge: 05/19/2021  Reason for Admission:   Kim Lloyd is a 30 yr old female who presents with SI with a plan to OD or shoot herself with a gun.  PPHx is significant for MDD, Recurrent, Severe, GAD, and ADHD, with 2 prior hospitalizations and 1 previous suicide attempt (OD).  She reported multiple Psychosocial stressors had built up.  She reported that her brother how is abusing substances is living with her and her boyfriend, failing multiple classes, and reduced hours at work.  Hospital Course:   During hospitalization, patient was evaluated by the psychiatric team.  They received multiple disciplinary care.  After initial intake evaluation, it was decided to adjust medications.  She was kept on her Wellbutrin and Catapres at the current doses. Her Buspar dose was reduced and she was started on Zoloft. As she reports no benefit from her Lamictal this was reduced with a plan to continue the taper by her outpatient provider. Patient not having side effects to psychiatric medications.   Patient received supportive psychotherapy and was encouraged to participate in group therapy during the hospitalization. Patient denied having suicidal thoughts more than 48 hours prior to discharge.    On day of discharge she reported that she is doing well today.  She reports that she slept well last night.  She reports her appetite has been good.  She reports no SI, HI, or AVH.  She reports no side effects from her medications.  She reports that her brother has moved out of the house and is now currently living with their aunt.  She reports that her brother came to visit her yesterday and that he did not  trigger her which is a significant improvement.  Discussed with her that her outpatient provider can continue to taper her off the Lamictal but that it must be done slowly due to to the potential side effect of Stevens-Johnson's.  Discussed with her that if she develops any rash that should be immediately seen at the nearest emergency room.  She reported understanding of this.  Discussed with her that she should follow-up with her PCP due to the elevated total bilirubin and elevated LDL found on her labs.  She reported understanding of this.  Discussed what she should do if she has a crisis in the future.  Discussed that she can return to [facility], go to [facility], or go to the nearest ED, go to 911 or [phone]. She reported understanding and no concerns.  She was discharged home to her boyfriend.   Principal Problem: MDD (major depressive disorder), recurrent severe, without psychosis ([place]) Discharge Diagnoses: Principal Problem:   MDD (major depressive disorder), recurrent severe, without psychosis ([place]) Active Problems:   PTSD (post-traumatic stress disorder)   Past Psychiatric History: MDD, Recurrent, Severe, GAD, and ADHD, with 2 prior hospitalizations and 1 previous suicide attempt (OD)  Past Medical History:  Past Medical History:  Diagnosis Date   Allergy    Anxiety    Bipolar 1 disorder ([place])    Depression    Eating disorder    remission   Gall bladder disease    gallbladder sluge which led to jaundice   GERD (gastroesophageal reflux disease)  History of chicken pox    age 84   Migraine headache without aura    Overdose drug    History reviewed. No pertinent surgical history. Family History:  Family History  Problem Relation Age of Onset   Mental illness Mother    Alcohol abuse Mother    Mental illness Father    Mental illness Brother    Alcohol abuse Maternal Aunt    Drug abuse Maternal Aunt    Hypothyroidism Maternal Grandmother    Coronary artery disease Maternal Grandfather     Family Psychiatric  History: Reports None Social History:  Social History   Substance and Sexual Activity  Alcohol Use Yes   Alcohol/week: 2.0 standard drinks   Types: 2 Cans of beer per week     Social History   Substance and Sexual Activity  Drug Use No    Social History   Socioeconomic History   Marital status: Single    Spouse name: Not on file   Number of children: Not on file   Years of education: Not on file   Highest education level: Not on file  Occupational History   Not on file  Tobacco Use   Smoking status: Never   Smokeless tobacco: Never  Vaping Use   Vaping Use: Never used  Substance and Sexual Activity   Alcohol use: Yes    Alcohol/week: 2.0 standard drinks    Types: 2 Cans of beer per week   Drug use: No   Sexual activity: Yes    Birth control/protection: Pill  Other Topics Concern   Not on file  Social History Narrative   Not on file   Social Determinants of Health   Financial Resource Strain: Not on file  Food Insecurity: Not on file  Transportation Needs: Not on file  Physical Activity: Not on file  Stress: Not on file  Social Connections: Not on file     Physical Findings:   Musculoskeletal: Strength & Muscle Tone: within normal limits Gait & Station: normal Patient leans: N/A   Psychiatric Specialty Exam:  Presentation  General Appearance: Appropriate for Environment; Casual; Fairly Groomed  Eye Contact:Good  Speech:Clear and Coherent; Normal Rate  Speech Volume:Normal  Handedness:Right   Mood and Affect  Mood:Euthymic  Affect:Appropriate; Congruent   Thought Process  Thought Processes:Coherent; Goal Directed  Descriptions of Associations:Intact  Orientation:Full (Time, Place and Person)  Thought Content:Logical; WDL  History of Schizophrenia/Schizoaffective disorder:No data recorded Duration of Psychotic Symptoms:No data recorded Hallucinations:Hallucinations: None  Ideas of  Reference:None  Suicidal Thoughts:Suicidal Thoughts: No  Homicidal Thoughts:Homicidal Thoughts: No   Sensorium  Memory:Immediate Good; Recent Good  Judgment:Fair  Insight:Good   Executive Functions  Concentration:Good  Attention Span:Good  [place] of Knowledge:Good  Language:Good   Psychomotor Activity  Psychomotor Activity:Psychomotor Activity: Normal   Assets  Assets:Communication Skills; Desire for Improvement; Physical Health; Resilience; Social Support; Housing   Sleep  Sleep:Sleep: Good Number of Hours of Sleep: 6.75    Physical Exam: Physical Exam Vitals and nursing note reviewed.  Constitutional:      General: She is not in acute distress.    Appearance: Normal appearance. She is normal weight. She is not ill-appearing or toxic-appearing.  HENT:     Head: Normocephalic and atraumatic.  Pulmonary:     Effort: Pulmonary effort is normal.  Musculoskeletal:        General: Normal range of motion.  Neurological:     General: No focal deficit present.  Mental Status: She is alert.   Review of Systems  Respiratory:  Negative for cough and shortness of breath.   Cardiovascular:  Negative for chest pain.  Gastrointestinal:  Negative for abdominal pain, constipation, diarrhea, nausea and vomiting.  Neurological:  Negative for weakness and headaches.  Psychiatric/Behavioral:  Negative for depression, hallucinations and suicidal ideas. The patient is not nervous/anxious.   Blood pressure 131/78, pulse 76, temperature 98.5 F (36.9 C), temperature source Oral, resp. rate 16, height 5\' 4"  (1.626 m), weight 73.5 kg, last menstrual period 05/07/2021, SpO2 98 %. Body mass index is 27.81 kg/m.   Social History   Tobacco Use  Smoking Status Never  Smokeless Tobacco Never   Tobacco Cessation:  N/A, patient does not currently use tobacco products   Blood Alcohol level:  Lab Results  Component Value Date   ETH <11 [id number]     Metabolic Disorder Labs:  Lab Results  Component Value Date   HGBA1C 4.6 (L) 05/14/2021   MPG 85.32 05/14/2021   No results found for: PROLACTIN Lab Results  Component Value Date   CHOL 200 05/14/2021   TRIG 75 05/14/2021   HDL 64 05/14/2021   CHOLHDL 3.1 05/14/2021   VLDL 15 05/14/2021   LDLCALC 121 (H) 05/14/2021   LDLCALC 62 11/17/2017    See Psychiatric Specialty Exam and Suicide Risk Assessment completed by Attending Physician prior to discharge.  Discharge destination:  Home  Is patient on multiple antipsychotic therapies at discharge:  No   Has Patient had three or more failed trials of antipsychotic monotherapy by history:  No  Recommended Plan for Multiple Antipsychotic Therapies: NA  Discharge Instructions     Diet - low sodium heart healthy   Complete by: As directed    Increase activity slowly   Complete by: As directed       Prescriptions given at discharge. Patient agreeable to plan. Given opportunity to ask questions. Appears to feel comfortable with discharge denies any current suicidal or homicidal thought.  Patient is also instructed prior to discharge to: Take all medications as prescribed by mental healthcare provider. Report any adverse effects and or reactions from the medicines to outpatient provider promptly. Patient has been instructed & cautioned: To not engage in alcohol and or illegal drug use while on prescription medicines. In the event of worsening symptoms,  patient is instructed to call the crisis hotline, 911 and or go to the nearest ED for appropriate evaluation and treatment of symptoms. To follow-up with primary care provider for other medical issues, concerns and or health care needs  The patient was evaluated each day by a clinical provider to ascertain response to treatment. Improvement was noted by the patient's report of decreasing symptoms, improved sleep and appetite, affect, medication tolerance, behavior, and participation  in unit programming.  Patient was asked each day to complete a self inventory noting mood, mental status, pain, new symptoms, anxiety and concerns.  Patient responded well to medication and being in a therapeutic and supportive environment. Positive and appropriate behavior was noted and the patient was motivated for recovery. The patient worked closely with the treatment team and case manager to develop a discharge plan with appropriate goals. Coping skills, problem solving as well as relaxation therapies were also part of the unit programming.  By the day of discharge patient was in much improved condition than upon admission.  Symptoms were reported as significantly decreased or resolved completely. The patient denied SI/HI and voiced no AVH. The patient  was motivated to continue taking medication with a goal of continued improvement in mental health.   Patient was discharged home with a plan to follow up as noted below.   Allergies as of 05/19/2021       Reactions   Other Anaphylaxis   Almonds    Coconut Oil         Medication List     TAKE these medications      Indication  Adzenys XR-ODT 6.3 MG Tbed Generic drug: Amphetamine ER Take 1 tablet by mouth daily.  Indication: Attention Deficit Hyperactivity Disorder   buPROPion 300 MG 24 hr tablet Commonly known as: WELLBUTRIN XL Take 300 mg by mouth daily.  Indication: Major Depressive Disorder   busPIRone 10 MG tablet Commonly known as: BUSPAR Take 1 tablet (10 mg total) by mouth 2 (two) times daily. What changed:  medication strength how much to take  Indication: Anxiety Disorder   cloNIDine 0.3 MG tablet Commonly known as: CATAPRES Take 0.3 mg by mouth at bedtime.  Indication: High Blood Pressure Disorder   famotidine 20 MG tablet Commonly known as: PEPCID Take 2 tablets (40 mg total) by mouth at bedtime.  Indication: Gastroesophageal Reflux Disease   [name] 0.15-0.03 &0.01 MG tablet Generic drug:  Levonorgestrel-Ethinyl Estradiol TAKE ONE TABLET BY MOUTH DAILY  Indication: Birth Control Treatment   lamoTRIgine 100 MG tablet Commonly known as: LAMICTAL Take 1 tablet (100 mg total) by mouth at bedtime. What changed:  medication strength how much to take when to take this  Indication: Manic-Depression   pantoprazole 40 MG tablet Commonly known as: PROTONIX TAKE ONE TABLET BY MOUTH EVERY MORNING BEFORE BREAKFAST  Indication: Gastroesophageal Reflux Disease   sertraline 25 MG tablet Commonly known as: ZOLOFT Take 1 tablet (25 mg total) by mouth at bedtime.  Indication: Major Depressive Disorder        Follow-up Information     [facility]. Go on 05/26/2021.   Why: You have a hospital follow up appointment for therapy and medication management services on 05/26/21 at 3:00 pm.  This appointment will be held in person. Contact information: [address], [place], [place] [address]  Phone: [phone] Fax:  [phone]        [facility]. Go to.   Why: You have a psychiatry appointment set up for Monday 11/21 at 5pm with [facility].  Appointment will be in person.  You can also inquire about getting set up for therapy services if needed while at appointment. Contact information: [address],  [address] [place] [address]        [facility] Call.   Specialty: Behavioral Health Why: A referral has been made to this provider for the intensive outpatient therapy services program.  Please schedule an appointment. Contact information: [address] [id number] mc [place] [address] [phone]                Follow-up recommendations:   - Activity as tolerated. - Diet as recommended by PCP. - Keep all scheduled follow-up appointments as recommended.  Comments:  Patient is instructed to take all prescribed medications as recommended. Report  any side effects or adverse reactions to your outpatient psychiatrist. Patient is instructed to abstain from alcohol and illegal drugs while on prescription medications. In the event of worsening symptoms, patient is instructed to call the crisis hotline, 911, or go to the nearest emergency department for  evaluation and treatment.  Signed: Briant Cedar, MD 05/19/2021, 12:58 PM

## 2021-05-19 NOTE — Progress Notes (Signed)
Adult Psychoeducational Group Note  Date:  05/19/2021 Time:  10:48 AM  Group Topic/Focus:  Goals Group:   The focus of this group is to help patients establish daily goals to achieve during treatment and discuss how the patient can incorporate goal setting into their daily lives to aide in recovery.  Participation Level:  Active  Participation Quality:  Appropriate  Affect:  Appropriate  Cognitive:  Appropriate  Insight: Appropriate  Engagement in Group:  Engaged  Modes of Intervention:  Discussion  Additional Comments:  Pt stated her goal for the day is to prepare for discharge.  Wynema Birch D 05/19/2021, 10:48 AM
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
## 2021-05-19 NOTE — Progress Notes (Signed)
  [facility] Adult Case Management Discharge Plan :  Will you be returning to the same living situation after discharge:  Yes,  living with boyfriend in house  At discharge, do you have transportation home?: Yes,  Boyfriend will be picking patient up at [facility] Do you have the ability to pay for your medications: Yes,  insurance  Release of information consent forms completed and in the chart;  Patient's signature needed at discharge.  Patient to Follow up at:  Follow-up Information     [facility]. Go on 05/26/2021.   Why: You have a hospital follow up appointment for therapy and medication management services on 05/26/21 at 3:00 pm.  This appointment will be held in person. Contact information: [address], [place], [place] [address]  Phone: [phone] Fax:  [phone]        [facility]. Go to.   Why: You have a psychiatry appointment set up for Monday 11/21 at 5pm with [facility].  Appointment will be in person.  You can also inquire about getting set up for therapy services if needed while at appointment. Contact information: [address]  [address] [place] [address]        [facility] Call.   Specialty: Behavioral Health Why: A referral has been made to this provider for the intensive outpatient therapy services program.  Please schedule an appointment. Contact information: [address] [phone] mc [place] [address] [phone]                Next level of care provider has access to [facility] Link:yes  Safety Planning and Suicide Prevention discussed: Yes,  boyfriend     Has patient been referred to the Quitline?: N/A patient is not a smoker  Patient has been referred for addiction treatment: N/A  Stavros Cail E Harper Vandervoort, LCSW 05/19/2021, 9:57 AM

## 2021-05-19 NOTE — BH IP Treatment Plan (Signed)
Interdisciplinary Treatment and Diagnostic Plan Update  05/19/2021 Time of Session: 9:15am Shirleyann HENNA DAURIA MRN: [id number]  Principal Diagnosis: MDD (major depressive disorder), recurrent severe, without psychosis ([place])  Secondary Diagnoses: Principal Problem:   MDD (major depressive disorder), recurrent severe, without psychosis ([place]) Active Problems:   PTSD (post-traumatic stress disorder)   Current Medications:  Current Facility-Administered Medications  Medication Dose Route Frequency Provider Last Rate Last Admin   acetaminophen (TYLENOL) tablet 650 mg  650 mg Oral Q6H PRN Nwoko, Uchenna E, PA   650 mg at 05/17/21 1112   alum & mag hydroxide-simeth (MAALOX/MYLANTA) 200-200-20 MG/5ML suspension 30 mL  30 mL Oral Q4H PRN Nwoko, Uchenna E, PA       buPROPion (WELLBUTRIN XL) 24 hr tablet 300 mg  300 mg Oral QHS Nwoko, Uchenna E, PA   300 mg at 05/18/21 2106   busPIRone (BUSPAR) tablet 10 mg  10 mg Oral BID Briant Cedar, MD   10 mg at 05/19/21 [id number]   cloNIDine (CATAPRES) tablet 0.3 mg  0.3 mg Oral QHS Nwoko, Uchenna E, PA   0.3 mg at 05/18/21 2106   famotidine (PEPCID) tablet 40 mg  40 mg Oral QHS Nwoko, Uchenna E, PA   40 mg at 05/18/21 2107   hydrOXYzine (ATARAX/VISTARIL) tablet 25 mg  25 mg Oral TID PRN Nwoko, Uchenna E, PA   25 mg at 05/14/21 1242   lamoTRIgine (LAMICTAL) tablet 100 mg  100 mg Oral QHS Nelda Marseille, Amy E, MD   100 mg at 05/18/21 2107   Levonorgestrel-Ethinyl Estradiol (AMETHIA) 0.15-0.03 &0.01 MG tablet 1 tablet  1 tablet Oral QHS Harlow Asa, MD   1 tablet at 05/18/21 2107   magnesium hydroxide (MILK OF MAGNESIA) suspension 30 mL  30 mL Oral Daily PRN Nwoko, Uchenna E, PA       ondansetron (ZOFRAN-ODT) disintegrating tablet 4 mg  4 mg Oral Once Briant Cedar, MD       pantoprazole (PROTONIX) EC tablet 40 mg  40 mg Oral Daily Nwoko, Uchenna E, PA   40 mg at 05/19/21 [id number]   sertraline (ZOLOFT) tablet 25 mg  25 mg Oral QHS Briant Cedar, MD    25 mg at 05/18/21 2107   traZODone (DESYREL) tablet 50 mg  50 mg Oral QHS PRN Nwoko, Uchenna E, PA       PTA Medications: Medications Prior to Admission  Medication Sig Dispense Refill Last Dose   ADZENYS XR-ODT 6.3 MG TBED Take 1 tablet by mouth daily.   Past Month   buPROPion (WELLBUTRIN XL) 300 MG 24 hr tablet Take 300 mg by mouth daily.   Past Week   busPIRone (BUSPAR) 15 MG tablet Take 15 mg by mouth 2 (two) times daily.   Past Week   cloNIDine (CATAPRES) 0.3 MG tablet Take 0.3 mg by mouth at bedtime.   Past Week   famotidine (PEPCID) 20 MG tablet Take 2 tablets (40 mg total) by mouth at bedtime. 60 tablet 3 Past Week   [name] 0.15-0.03 &0.01 MG tablet TAKE ONE TABLET BY MOUTH DAILY 91 tablet 3 Past Week   lamoTRIgine (LAMICTAL) 150 MG tablet Take 150 mg by mouth daily.   Past Week   pantoprazole (PROTONIX) 40 MG tablet TAKE ONE TABLET BY MOUTH EVERY MORNING BEFORE BREAKFAST 90 tablet 0 Past Week    Patient Stressors: Educational concerns   Financial difficulties   Marital or family conflict   Occupational concerns    Patient Strengths: Ability for  insight  General fund of knowledge  Motivation for treatment/growth  Work skills   Treatment Modalities: Medication Management, Group therapy, Case management,  1 to 1 session with clinician, Psychoeducation, Recreational therapy.   Physician Treatment Plan for Primary Diagnosis: MDD (major depressive disorder), recurrent severe, without psychosis ([place]) Long Term Goal(s): Improvement in symptoms so as ready for discharge   Short Term Goals: Ability to identify changes in lifestyle to reduce recurrence of condition will improve Ability to verbalize feelings will improve Ability to disclose and discuss suicidal ideas Ability to demonstrate self-control will improve Ability to identify and develop effective coping behaviors will improve Ability to maintain clinical measurements within normal limits will improve Compliance with  prescribed medications will improve Ability to identify triggers associated with substance abuse/mental health issues will improve  Medication Management: Evaluate patient's response, side effects, and tolerance of medication regimen.  Therapeutic Interventions: 1 to 1 sessions, Unit Group sessions and Medication administration.  Evaluation of Outcomes: Adequate for Discharge  Physician Treatment Plan for Secondary Diagnosis: Principal Problem:   MDD (major depressive disorder), recurrent severe, without psychosis ([place]) Active Problems:   PTSD (post-traumatic stress disorder)  Long Term Goal(s): Improvement in symptoms so as ready for discharge   Short Term Goals: Ability to identify changes in lifestyle to reduce recurrence of condition will improve Ability to verbalize feelings will improve Ability to disclose and discuss suicidal ideas Ability to demonstrate self-control will improve Ability to identify and develop effective coping behaviors will improve Ability to maintain clinical measurements within normal limits will improve Compliance with prescribed medications will improve Ability to identify triggers associated with substance abuse/mental health issues will improve     Medication Management: Evaluate patient's response, side effects, and tolerance of medication regimen.  Therapeutic Interventions: 1 to 1 sessions, Unit Group sessions and Medication administration.  Evaluation of Outcomes: Adequate for Discharge   RN Treatment Plan for Primary Diagnosis: MDD (major depressive disorder), recurrent severe, without psychosis ([place]) Long Term Goal(s): Knowledge of disease and therapeutic regimen to maintain health will improve  Short Term Goals: Ability to remain free from injury will improve, Ability to verbalize frustration and anger appropriately will improve, Ability to demonstrate self-control, and Compliance with prescribed medications will improve  Medication  Management: RN will administer medications as ordered by provider, will assess and evaluate patient's response and provide education to patient for prescribed medication. RN will report any adverse and/or side effects to prescribing provider.  Therapeutic Interventions: 1 on 1 counseling sessions, Psychoeducation, Medication administration, Evaluate responses to treatment, Monitor vital signs and CBGs as ordered, Perform/monitor CIWA, COWS, AIMS and Fall Risk screenings as ordered, Perform wound care treatments as ordered.  Evaluation of Outcomes: Adequate for Discharge   LCSW Treatment Plan for Primary Diagnosis: MDD (major depressive disorder), recurrent severe, without psychosis ([place]) Long Term Goal(s): Safe transition to appropriate next level of care at discharge, Engage patient in therapeutic group addressing interpersonal concerns.  Short Term Goals: Engage patient in aftercare planning with referrals and resources, Increase social support, Increase ability to appropriately verbalize feelings, Increase emotional regulation, and Increase skills for wellness and recovery  Therapeutic Interventions: Assess for all discharge needs, 1 to 1 time with Social worker, Explore available resources and support systems, Assess for adequacy in community support network, Educate family and significant other(s) on suicide prevention, Complete Psychosocial Assessment, Interpersonal group therapy.  Evaluation of Outcomes: Adequate for Discharge   Progress in Treatment: Attending groups: Yes. Participating in groups: Yes. Taking medication as  prescribed: Yes. Toleration medication: Yes. Family/Significant other contact made: Yes, individual(s) contacted:  Boyfriend  Patient understands diagnosis: Yes. and No. Discussing patient identified problems/goals with staff: Yes. Medical problems stabilized or resolved: Yes. Denies suicidal/homicidal ideation: Yes. Issues/concerns per patient self-inventory:  No.     New problem(s) identified: No, Describe:  None    New Short Term/Long Term Goal(s): medication stabilization, elimination of SI thoughts, development of comprehensive mental wellness plan.    Patient Goals: "To learn how to manage my anxiety and gain coping skills"   Discharge Plan or Barriers: Pt is to return home and is to follow up with outpatient providers for therapy and medication management.   Reason for Continuation of Hospitalization: Medication stabilization  Estimated Length of Stay: 1 day   Scribe for Treatment Team: Otelia Santee, LCSW 05/19/2021 10:16 AM

## 2021-05-20 ENCOUNTER — Telehealth (HOSPITAL_COMMUNITY): Payer: Self-pay | Admitting: Professional

## 2021-07-23 DIAGNOSIS — R69 Illness, unspecified: Secondary | ICD-10-CM | POA: Diagnosis not present

## 2021-07-24 DIAGNOSIS — R69 Illness, unspecified: Secondary | ICD-10-CM | POA: Diagnosis not present

## 2021-07-30 DIAGNOSIS — R69 Illness, unspecified: Secondary | ICD-10-CM | POA: Diagnosis not present

## 2021-08-06 DIAGNOSIS — R69 Illness, unspecified: Secondary | ICD-10-CM | POA: Diagnosis not present

## 2021-08-13 DIAGNOSIS — R69 Illness, unspecified: Secondary | ICD-10-CM | POA: Diagnosis not present

## 2021-08-20 DIAGNOSIS — R69 Illness, unspecified: Secondary | ICD-10-CM | POA: Diagnosis not present

## 2021-09-17 DIAGNOSIS — R69 Illness, unspecified: Secondary | ICD-10-CM | POA: Diagnosis not present

## 2021-09-23 DIAGNOSIS — R69 Illness, unspecified: Secondary | ICD-10-CM | POA: Diagnosis not present

## 2021-10-01 DIAGNOSIS — R69 Illness, unspecified: Secondary | ICD-10-CM | POA: Diagnosis not present

## 2021-10-08 DIAGNOSIS — R69 Illness, unspecified: Secondary | ICD-10-CM | POA: Diagnosis not present

## 2021-10-14 DIAGNOSIS — R69 Illness, unspecified: Secondary | ICD-10-CM | POA: Diagnosis not present

## 2021-10-14 DIAGNOSIS — F900 Attention-deficit hyperactivity disorder, predominantly inattentive type: Secondary | ICD-10-CM | POA: Diagnosis not present

## 2021-10-14 DIAGNOSIS — F4321 Adjustment disorder with depressed mood: Secondary | ICD-10-CM | POA: Diagnosis not present

## 2021-10-22 DIAGNOSIS — R69 Illness, unspecified: Secondary | ICD-10-CM | POA: Diagnosis not present

## 2023-06-10 ENCOUNTER — Ambulatory Visit: Payer: Medicaid Other

## 2023-06-10 VITALS — BP 131/84 | Ht 64.0 in | Wt 191.5 lb

## 2023-06-10 DIAGNOSIS — Z3201 Encounter for pregnancy test, result positive: Secondary | ICD-10-CM | POA: Diagnosis not present

## 2023-06-10 DIAGNOSIS — Z3009 Encounter for other general counseling and advice on contraception: Secondary | ICD-10-CM

## 2023-06-10 LAB — PREGNANCY, URINE: Preg Test, Ur: POSITIVE — AB

## 2023-06-10 MED ORDER — PRENATAL 27-0.8 MG PO TABS: 1.0000 | ORAL_TABLET | Freq: Every day | ORAL | Status: AC

## 2023-06-10 NOTE — Progress Notes (Signed)
UPT positive. Plans care at [facility]; advised patient to schedule prenatal appt ASAP. Patient is currently on prescription medication. Consulted with Lorrin Mais, MD who advises that the patient continue taking medication as prescribed until she has appt with OB doctor. Patient agrees.   The patient was dispensed prenatal vitamins #100 today. I provided counseling today regarding the medication. We discussed the medication, the side effects and when to call clinic.   Positive pregnancy packet reviewed and given to patient. Also counseled on hydration and when to seek medical attention.  Patient given the opportunity to ask questions. Questions answered.  Abagail Kitchens, RN

## 2023-06-26 ENCOUNTER — Encounter (HOSPITAL_COMMUNITY): Payer: Self-pay | Admitting: Family Medicine

## 2023-06-26 ENCOUNTER — Inpatient Hospital Stay (HOSPITAL_COMMUNITY)
Admission: EM | Admit: 2023-06-26 | Discharge: 2023-06-26 | Disposition: A | Discharge status: Home / Self Care | Payer: MEDICAID | Attending: Family Medicine | Admitting: Family Medicine

## 2023-06-26 ENCOUNTER — Encounter: Payer: Self-pay | Admitting: Obstetrics and Gynecology

## 2023-06-26 ENCOUNTER — Inpatient Hospital Stay (HOSPITAL_COMMUNITY): Payer: MEDICAID

## 2023-06-26 ENCOUNTER — Other Ambulatory Visit: Payer: Self-pay

## 2023-06-26 DIAGNOSIS — O26899 Other specified pregnancy related conditions, unspecified trimester: Secondary | ICD-10-CM

## 2023-06-26 DIAGNOSIS — R1030 Lower abdominal pain, unspecified: Secondary | ICD-10-CM

## 2023-06-26 DIAGNOSIS — O26891 Other specified pregnancy related conditions, first trimester: Secondary | ICD-10-CM

## 2023-06-26 DIAGNOSIS — R109 Unspecified abdominal pain: Secondary | ICD-10-CM | POA: Insufficient documentation

## 2023-06-26 DIAGNOSIS — O219 Vomiting of pregnancy, unspecified: Secondary | ICD-10-CM | POA: Insufficient documentation

## 2023-06-26 DIAGNOSIS — O210 Mild hyperemesis gravidarum: Secondary | ICD-10-CM

## 2023-06-26 DIAGNOSIS — O208 Other hemorrhage in early pregnancy: Secondary | ICD-10-CM | POA: Insufficient documentation

## 2023-06-26 DIAGNOSIS — Z3A01 Less than 8 weeks gestation of pregnancy: Secondary | ICD-10-CM | POA: Insufficient documentation

## 2023-06-26 LAB — CBC
HCT: 34 % — ABNORMAL LOW (ref 36.0–46.0)
Hemoglobin: 11.7 g/dL — ABNORMAL LOW (ref 12.0–15.0)
MCH: 25.7 pg — ABNORMAL LOW (ref 26.0–34.0)
MCHC: 34.4 g/dL (ref 30.0–36.0)
MCV: 74.7 fL — ABNORMAL LOW (ref 80.0–100.0)
Platelets: 342 10*3/uL (ref 150–400)
RBC: 4.55 MIL/uL (ref 3.87–5.11)
RDW: 15.3 % (ref 11.5–15.5)
WBC: 9.5 10*3/uL (ref 4.0–10.5)
nRBC: 0 % (ref 0.0–0.2)

## 2023-06-26 LAB — COMPREHENSIVE METABOLIC PANEL
ALT: 19 U/L (ref 0–44)
AST: 19 U/L (ref 15–41)
Albumin: 4 g/dL (ref 3.5–5.0)
Alkaline Phosphatase: 76 U/L (ref 38–126)
Anion gap: 8 (ref 5–15)
BUN: 8 mg/dL (ref 6–20)
CO2: 23 mmol/L (ref 22–32)
Calcium: 9.5 mg/dL (ref 8.9–10.3)
Chloride: 104 mmol/L (ref 98–111)
Creatinine, Ser: 0.69 mg/dL (ref 0.44–1.00)
GFR, Estimated: >60 mL/min (ref >60)
Glucose, Bld: 100 mg/dL — ABNORMAL HIGH (ref 70–99)
Potassium: 3.8 mmol/L (ref 3.5–5.1)
Sodium: 135 mmol/L (ref 135–145)
Total Bilirubin: 1.1 mg/dL (ref <1.2)
Total Protein: 6.9 g/dL (ref 6.5–8.1)

## 2023-06-26 LAB — URINALYSIS, ROUTINE W REFLEX MICROSCOPIC
Bilirubin Urine: NEGATIVE
Glucose, UA: NEGATIVE mg/dL
Hgb urine dipstick: NEGATIVE
Ketones, ur: 5 mg/dL — AB
Nitrite: NEGATIVE
Protein, ur: NEGATIVE mg/dL
Specific Gravity, Urine: 1.018 (ref 1.005–1.030)
pH: 5 (ref 5.0–8.0)

## 2023-06-26 LAB — WET PREP, GENITAL
Sperm: NONE SEEN
Trich, Wet Prep: NONE SEEN
WBC, Wet Prep HPF POC: >=10 — AB (ref <10)
Yeast Wet Prep HPF POC: NONE SEEN

## 2023-06-26 LAB — GROUP A STREP BY PCR: Group A Strep by PCR: NOT DETECTED

## 2023-06-26 LAB — ABO/RH: ABO/RH(D): A POS

## 2023-06-26 LAB — HCG, QUANTITATIVE, PREGNANCY: hCG, Beta Chain, Quant, S: 50329 m[IU]/mL — ABNORMAL HIGH (ref <5)

## 2023-06-26 MED ORDER — DOXYLAMINE-PYRIDOXINE 10-10 MG PO TBEC: 2.0000 | DELAYED_RELEASE_TABLET | Freq: Every day | ORAL | 2 refills | Status: AC

## 2023-06-26 MED ORDER — METOCLOPRAMIDE HCL 10 MG PO TABS
10.0000 mg | ORAL_TABLET | Freq: Once | ORAL | Status: AC
  Administered 2023-06-26: 10 mg via ORAL
  Filled 2023-06-26: qty 1

## 2023-06-26 MED ORDER — METOCLOPRAMIDE HCL 10 MG PO TABS: 10.0000 mg | ORAL_TABLET | Freq: Four times a day (QID) | ORAL | 1 refills | Status: AC | PRN

## 2023-06-26 MED ORDER — METRONIDAZOLE 0.75 % VA GEL: 1.0000 | Freq: Every day | VAGINAL | 1 refills | Status: AC

## 2023-06-26 NOTE — Discharge Instructions (Signed)

## 2023-06-26 NOTE — MAU Note (Addendum)
.  Kim Lloyd is a 32 y.o. at [redacted]w[redacted]d here in MAU reporting: here from main ED - last night at work had to lift up booths to clean underneath and there was some type of powder all over everything. Reports multiple people from work have since been sick. States she has been having N/V - emesis 4-5 times today and unable to keep anything down. Also reporting sharp lower abdominal pain on left and right side that comes and goes. Denies VB or abnormal discharge.   Onset of complaint: last night  Pain score: 4 Vitals:   06/26/23 0043  BP: 127/84  Pulse: 91  Resp: 18  Temp: 98.3 F (36.8 C)  SpO2: 100%     FHT:NA  Lab orders placed from triage:  UA

## 2023-06-26 NOTE — ED Provider Triage Note (Signed)
Emergency Medicine Provider OB Triage Evaluation Note  Kim Lloyd is a 32 y.o. female, G1P0000, at [redacted]w[redacted]d gestation who presents to the emergency department with complaints of suprapubic cramping, N/V. Concerned that symptoms may be due to exposure to an unknown substance/chemical at work; there was no ingestion associated with onset of pain. Denies vaginal bleeding, discharge, fevers. Not yet with confirmatory IUP.  Review of  Systems  Positive: as above Negative: as above  Physical Exam  BP 127/84 (BP Location: Right Arm)   Pulse 91   Temp 98.3 F (36.8 C)   Resp 18   LMP 05/07/2023   SpO2 100%  General: Awake, no distress  HEENT: Atraumatic  Resp: Normal effort  Cardiac: Normal rate Abd: Nondistended, nontender  MSK: Moves all extremities without difficulty Neuro: Speech clear  Medical Decision Making  Pt evaluated for pregnancy concern and is stable for transfer to MAU. Pt is in agreement with plan for transfer.  12:58 AM Discussed with MAU APP, who accepts patient in transfer.  Clinical Impression   1. Suprapubic cramping        Antony Madura, PA-C 06/26/23 3875

## 2023-06-26 NOTE — ED Triage Notes (Signed)
Patient is [redacted] weeks pregnant G1P0 , reports low abdominal cramping/emesis and sore throat after inhaling pesticides at work last night .

## 2023-06-26 NOTE — ED Notes (Signed)
Patient evaluated by PA at triage , report given to MAU RN .

## 2023-06-26 NOTE — MAU Provider Note (Signed)
History     CSN: [id number]  Arrival date and time: 06/26/23 0022   Event Date/Time   First Provider Initiated Contact with Patient 06/26/23 0144      Chief Complaint  Patient presents with   Abdominal Cramping/Sore Throat   Kim Lloyd is a 32 y.o. G1P0 at [redacted]w[redacted]d by Definite LMP of May 07, 2023 who will receive care at the [facility]; first appt is Jan 11th.  She presents today, via ED transfer, for abdominal cramping and nausea/vomiting.  She reports cramping started last night around 2130.  She states the pain is "variable" and she describes it as "sharp cramping similar to period, but sharper."  She denies relieving factors, but states quick movements like sneezing makes it worse.  She states the pain lasts 1-2 hrs when it occurs.  She rates the pain a 3-4/10. She reports vomiting earlier today around 4pm and reports 4-5 incidents today.  She is currently nauseous.   OB History     Gravida  1   Para  0   Term  0   Preterm  0   AB  0   Living  0      SAB  0   IAB  0   Ectopic  0   Multiple  0   Live Births  0           Past Medical History:  Diagnosis Date   Allergy    Anxiety    Bipolar 1 disorder (HCC)    Depression    Eating disorder    remission   Gall bladder disease    gallbladder sluge which led to jaundice   GERD (gastroesophageal reflux disease)    History of chicken pox    age 89   Migraine headache without aura    Overdose drug     No past surgical history on file.  Family History  Problem Relation Age of Onset   Mental illness Mother    Alcohol abuse Mother    Mental illness Father    Mental illness Brother    Alcohol abuse Maternal Aunt    Drug abuse Maternal Aunt    Hypothyroidism Maternal Grandmother    Coronary artery disease Maternal Grandfather     Social History   Tobacco Use   Smoking status: Never   Smokeless tobacco: Never  Vaping Use   Vaping status: Never Used  Substance Use Topics   Alcohol use: Not  Currently    Alcohol/week: 2.0 standard drinks of alcohol    Types: 2 Cans of beer per week    Comment: last alcoholic drink 04/2023   Drug use: Not Currently    Types: Marijuana    Comment: last MJ use 06/07/2023    Allergies:  Allergies  Allergen Reactions   Other Anaphylaxis    Almonds    Coconut (Cocos Nucifera)     Medications Prior to Admission  Medication Sig Dispense Refill Last Dose   ADZENYS XR-ODT 6.3 MG TBED Take 1 tablet by mouth daily.      buPROPion (WELLBUTRIN XL) 300 MG 24 hr tablet Take 300 mg by mouth daily.      cloNIDine (CATAPRES) 0.3 MG tablet Take 0.3 mg by mouth at bedtime.      famotidine (PEPCID) 20 MG tablet Take 2 tablets (40 mg total) by mouth at bedtime. (Patient not taking: Reported on 06/10/2023) 60 tablet 3    [name] 0.15-0.03 &0.01 MG tablet TAKE ONE TABLET BY MOUTH  DAILY 91 tablet 3    lamoTRIgine (LAMICTAL) 100 MG tablet Take 1 tablet (100 mg total) by mouth at bedtime. 30 tablet 0    pantoprazole (PROTONIX) 40 MG tablet TAKE ONE TABLET BY MOUTH EVERY MORNING BEFORE BREAKFAST 90 tablet 0    Prenatal Vit-Fe Fumarate-FA (MULTIVITAMIN-PRENATAL) 27-0.8 MG TABS tablet Take 1 tablet by mouth daily at 12 noon.      sertraline (ZOLOFT) 25 MG tablet Take 1 tablet (25 mg total) by mouth at bedtime. 30 tablet 0     Review of Systems  Gastrointestinal:  Positive for abdominal pain, diarrhea (930pm; Loose), nausea and vomiting. Negative for constipation.  Genitourinary:  Negative for difficulty urinating, dysuria, vaginal bleeding and vaginal discharge.   Physical Exam   Blood pressure 127/79, pulse 92, temperature 98.7 F (37.1 C), temperature source Oral, resp. rate 17, height 5\' 4"  (1.626 m), weight 86.3 kg, last menstrual period 05/07/2023, SpO2 98%.  Physical Exam Vitals and nursing note reviewed.  Constitutional:      General: She is not in acute distress.    Appearance: Normal appearance. She is not ill-appearing.  HENT:     Head:  Normocephalic and atraumatic.  Eyes:     Conjunctiva/sclera: Conjunctivae normal.  Musculoskeletal:        General: Normal range of motion.     Cervical back: Normal range of motion.  Neurological:     Mental Status: She is alert and oriented to person, place, and time.  Psychiatric:        Mood and Affect: Mood normal.        Behavior: Behavior normal.     MAU Course  Procedures Results for orders placed or performed during the hospital encounter of 06/26/23 (from the past 24 hour(s))  Group A Strep by PCR     Status: None   Collection Time: 06/26/23 12:49 AM   Specimen: Throat; Sterile Swab  Result Value Ref Range   Group A Strep by PCR NOT DETECTED NOT DETECTED  Urinalysis, Routine w reflex microscopic -Urine, Clean Catch     Status: Abnormal   Collection Time: 06/26/23  1:36 AM  Result Value Ref Range   Color, Urine YELLOW YELLOW   APPearance HAZY (A) CLEAR   Specific Gravity, Urine 1.018 1.005 - 1.030   pH 5.0 5.0 - 8.0   Glucose, UA NEGATIVE NEGATIVE mg/dL   Hgb urine dipstick NEGATIVE NEGATIVE   Bilirubin Urine NEGATIVE NEGATIVE   Ketones, ur 5 (A) NEGATIVE mg/dL   Protein, ur NEGATIVE NEGATIVE mg/dL   Nitrite NEGATIVE NEGATIVE   Leukocytes,Ua SMALL (A) NEGATIVE   RBC / HPF 0-5 0 - 5 RBC/hpf   WBC, UA 0-5 0 - 5 WBC/hpf   Bacteria, UA RARE (A) NONE SEEN   Squamous Epithelial / HPF 6-10 0 - 5 /HPF   Mucus PRESENT   CBC     Status: Abnormal   Collection Time: 06/26/23  1:56 AM  Result Value Ref Range   WBC 9.5 4.0 - 10.5 K/uL   RBC 4.55 3.87 - 5.11 MIL/uL   Hemoglobin 11.7 (L) 12.0 - 15.0 g/dL   HCT 16.1 (L) 09.6 - 04.5 %   MCV 74.7 (L) 80.0 - 100.0 fL   MCH 25.7 (L) 26.0 - 34.0 pg   MCHC 34.4 30.0 - 36.0 g/dL   RDW 40.9 81.1 - 91.4 %   Platelets 342 150 - 400 K/uL   nRBC 0.0 0.0 - 0.2 %  ABO/Rh  Status: None   Collection Time: 06/26/23  1:56 AM  Result Value Ref Range   ABO/RH(D) A POS    No rh immune globuloin      NOT A RH IMMUNE GLOBULIN  CANDIDATE, PT RH POSITIVE Performed at [facility], [address], [place], [place] [address]   Comprehensive metabolic panel     Status: Abnormal   Collection Time: 06/26/23  1:56 AM  Result Value Ref Range   Sodium 135 135 - 145 mmol/L   Potassium 3.8 3.5 - 5.1 mmol/L   Chloride 104 98 - 111 mmol/L   CO2 23 22 - 32 mmol/L   Glucose, Bld 100 (H) 70 - 99 mg/dL   BUN 8 6 - 20 mg/dL   Creatinine, Ser 5.62 0.44 - 1.00 mg/dL   Calcium 9.5 8.9 - 13.0 mg/dL   Total Protein 6.9 6.5 - 8.1 g/dL   Albumin 4.0 3.5 - 5.0 g/dL   AST 19 15 - 41 U/L   ALT 19 0 - 44 U/L   Alkaline Phosphatase 76 38 - 126 U/L   Total Bilirubin 1.1 <1.2 mg/dL   GFR, Estimated >86 >57 mL/min   Anion gap 8 5 - 15  Wet prep, genital     Status: Abnormal   Collection Time: 06/26/23  2:10 AM   Specimen: PATH Cytology Cervicovaginal Ancillary Only  Result Value Ref Range   Yeast Wet Prep HPF POC NONE SEEN NONE SEEN   Trich, Wet Prep NONE SEEN NONE SEEN   Clue Cells Wet Prep HPF POC PRESENT (A) NONE SEEN   WBC, Wet Prep HPF POC >=10 (A) <10   Sperm NONE SEEN    US OB LESS THAN 14 WEEKS WITH OB TRANSVAGINAL  Result Date: 06/26/2023 CLINICAL DATA:  Abdominal pain EXAM: OBSTETRIC <14 WK [place] AND TRANSVAGINAL OB US TECHNIQUE: Both transabdominal and transvaginal ultrasound examinations were performed for complete evaluation of the gestation as well as the maternal uterus, adnexal regions, and pelvic cul-de-sac. Transvaginal technique was performed to assess early pregnancy. COMPARISON:  None Available. FINDINGS: Intrauterine gestational sac: Single Yolk sac:  Visualized. Embryo:  Visualized. Cardiac Activity: Visualized. Heart Rate: 122 bpm MSD:   mm    w     d CRL:  8.2 mm   6 w   5 d                  [place] EDC: 02/14/2024 Subchorionic hemorrhage:  Moderate subchorionic hemorrhage Maternal uterus/adnexae: No adnexal mass or free fluid. IMPRESSION: Six week 5 day intrauterine pregnancy. Fetal heart rate 122 beats per minute.  Moderate subchorionic hemorrhage. Electronically Signed   By: Charlett Nose M.D.   On: 06/26/2023 02:38     MDM Physical Exam Cultures: Wet Prep and GC/CT Labs: UA, UPT, CBC, CMP, hCG, ABO Ultrasound Assessment and Plan  32 year old G1P0 at 7.1 weeks Abdominal Cramping Nausea  -Reviewed POC with patient. -Exam performed.  -Labs ordered. -Cultures to be collected by self swab. -Patient offered and declines pain medication. -Discussed usage of Reglan for current nausea and prescription for Diclegis for management at home.  Patient agreeable. -Give Reglan 10 mg. -For ultrasound and await results.  Cherre Robins 06/26/2023, 1:44 AM   Reassessment (2:42 AM) -REsults as above. -Provider to discuss. -Informed of IUP c/w dates. -Educated on [facility], what to expect including bleeding, risks for miscarriage, and resolution.  -Instructed to avoid sexual activity while bleeding and for 72 hours after resolution.  -  Informed of clue cells c/w BV.  Discussed that in setting of abdominal cramping would treat. Reviewed treatment. Patient desires vaginal treatment. Script sent to pharmacy on file.  -Medication for nausea sent. -Given list of pregnancy safe medications and information about [facility] in [facility]. -Precautions reviewed. -Discharged to home in stable condition.  Cherre Robins MSN, CNM Advanced Practice Provider, [facility] [employer]

## 2023-06-28 LAB — GC/CHLAMYDIA PROBE AMP (~~LOC~~) NOT AT ARMC
Chlamydia: NEGATIVE
Comment: NEGATIVE
Comment: NORMAL
Neisseria Gonorrhea: NEGATIVE

## 2023-07-20 ENCOUNTER — Inpatient Hospital Stay (HOSPITAL_COMMUNITY)
Admission: AD | Admit: 2023-07-20 | Discharge: 2023-07-20 | Disposition: A | Discharge status: Home / Self Care | Payer: MEDICAID | Attending: Obstetrics and Gynecology | Admitting: Obstetrics and Gynecology

## 2023-07-20 ENCOUNTER — Other Ambulatory Visit: Payer: Self-pay

## 2023-07-20 ENCOUNTER — Inpatient Hospital Stay (HOSPITAL_COMMUNITY): Payer: MEDICAID

## 2023-07-20 DIAGNOSIS — O26891 Other specified pregnancy related conditions, first trimester: Secondary | ICD-10-CM | POA: Insufficient documentation

## 2023-07-20 DIAGNOSIS — O208 Other hemorrhage in early pregnancy: Secondary | ICD-10-CM | POA: Insufficient documentation

## 2023-07-20 DIAGNOSIS — O418X10 Other specified disorders of amniotic fluid and membranes, first trimester, not applicable or unspecified: Secondary | ICD-10-CM

## 2023-07-20 DIAGNOSIS — O26899 Other specified pregnancy related conditions, unspecified trimester: Secondary | ICD-10-CM

## 2023-07-20 DIAGNOSIS — Z3A10 10 weeks gestation of pregnancy: Secondary | ICD-10-CM | POA: Insufficient documentation

## 2023-07-20 DIAGNOSIS — R102 Pelvic and perineal pain: Secondary | ICD-10-CM | POA: Insufficient documentation

## 2023-07-20 DIAGNOSIS — O468X1 Other antepartum hemorrhage, first trimester: Secondary | ICD-10-CM

## 2023-07-20 LAB — URINALYSIS, ROUTINE W REFLEX MICROSCOPIC
Bilirubin Urine: NEGATIVE
Glucose, UA: NEGATIVE mg/dL
Hgb urine dipstick: NEGATIVE
Ketones, ur: 5 mg/dL — AB
Nitrite: NEGATIVE
Protein, ur: NEGATIVE mg/dL
Specific Gravity, Urine: 1.023 (ref 1.005–1.030)
pH: 5 (ref 5.0–8.0)
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
## 2023-07-20 NOTE — MAU Note (Signed)
.  Kim Lloyd is a 32 y.o. at [redacted]w[redacted]d here in MAU reporting: reports sharp pelvic pain that started yesterday - went away for a while but came back tonight at work along with mid-lower abdominal cramping. Denies VB. Reports having subchorionic hemorrhage, so she wants to make sure everything is okay.   Pain score: 5 - pelvis; 4 - abdomen  Vitals:   07/20/23 0138  BP: 121/75  Pulse: 91  Resp: 19  Temp: 98.1 F (36.7 C)  SpO2: 100%     FHT: unable to hear with doppler  Lab orders placed from triage: UA

## 2023-08-03 LAB — PANORAMA PRENATAL TEST FULL PANEL:PANORAMA TEST PLUS 5 ADDITIONAL MICRODELETIONS: FETAL FRACTION: 6.5

## 2023-08-06 ENCOUNTER — Emergency Department
Admission: EM | Admit: 2023-08-06 | Discharge: 2023-08-06 | Disposition: A | Discharge status: Home / Self Care | Payer: Medicaid Other | Attending: Emergency Medicine | Admitting: Emergency Medicine

## 2023-08-06 ENCOUNTER — Other Ambulatory Visit: Payer: Self-pay

## 2023-08-06 DIAGNOSIS — N3000 Acute cystitis without hematuria: Secondary | ICD-10-CM

## 2023-08-06 DIAGNOSIS — Z3A13 13 weeks gestation of pregnancy: Secondary | ICD-10-CM | POA: Diagnosis not present

## 2023-08-06 DIAGNOSIS — O2311 Infections of bladder in pregnancy, first trimester: Secondary | ICD-10-CM | POA: Insufficient documentation

## 2023-08-06 LAB — URINALYSIS, ROUTINE W REFLEX MICROSCOPIC
Bilirubin Urine: NEGATIVE
Glucose, UA: NEGATIVE mg/dL
Hgb urine dipstick: NEGATIVE
Ketones, ur: 20 mg/dL — AB
Nitrite: NEGATIVE
Protein, ur: 30 mg/dL — AB
Specific Gravity, Urine: 1.02 (ref 1.005–1.030)
WBC, UA: >50 WBC/hpf (ref 0–5)
pH: 5 (ref 5.0–8.0)

## 2023-08-06 MED ORDER — CEPHALEXIN 500 MG PO CAPS: 500.0000 mg | ORAL_CAPSULE | Freq: Four times a day (QID) | ORAL | 0 refills | Status: AC

## 2023-08-06 MED ORDER — CEPHALEXIN 500 MG PO CAPS
500.0000 mg | ORAL_CAPSULE | Freq: Once | ORAL | Status: AC
  Administered 2023-08-06: 500 mg via ORAL
  Filled 2023-08-06: qty 1

## 2023-08-06 MED ORDER — ONDANSETRON 4 MG PO TBDP
4.0000 mg | ORAL_TABLET | Freq: Once | ORAL | Status: AC
  Administered 2023-08-06: 4 mg via ORAL
  Filled 2023-08-06: qty 1

## 2023-08-06 MED ORDER — ACETAMINOPHEN 500 MG PO TABS
1000.0000 mg | ORAL_TABLET | Freq: Once | ORAL | Status: AC
  Administered 2023-08-06: 1000 mg via ORAL
  Filled 2023-08-06: qty 2

## 2023-08-06 MED ORDER — ONDANSETRON 4 MG PO TBDP: 4.0000 mg | ORAL_TABLET | Freq: Three times a day (TID) | ORAL | 0 refills | Status: AC | PRN

## 2023-08-06 NOTE — ED Provider Triage Note (Signed)
Emergency Medicine Provider Triage Evaluation Note  Kim Lloyd , a 33 y.o. female  was evaluated in triage.  Pt complains of dysuria, nausea, and suprapubic pain x 30 minutes. She is [redacted] weeks pregnant. Denies vaginal bleeding, discharge, or pelvic cramping.  Physical Exam  BP 114/75   Pulse 86   Temp 98 F (36.7 C) (Oral)   Resp 16   LMP 05/07/2023   SpO2 100%  Gen:   Awake, no distress   Resp:  Normal effort  MSK:   Moves extremities without difficulty  Other:    Medical Decision Making  Medically screening exam initiated at 8:30 PM.  Appropriate orders placed.  Kim Lloyd was informed that the remainder of the evaluation will be completed by another provider, this initial triage assessment does not replace that evaluation, and the importance of remaining in the ED until their evaluation is complete.    Chinita Pester, FNP 08/06/23 2031

## 2023-08-06 NOTE — ED Triage Notes (Addendum)
Pt to ed from home via POV for abdom pain and UTI symptoms. Pt has burning during urination and cloudy and had fowl smell. Pt is caox4, in no acute distress and ambulatory in triage. Pt is 13 wks preg with 1st baby and her OBGYN is at [facility].

## 2023-08-06 NOTE — ED Provider Notes (Signed)
[facility] Provider Note    Event Date/Time   First MD Initiated Contact with Patient 08/06/23 2305     (approximate)   History   Chief Complaint Urinary Frequency   HPI  Kim Lloyd is a 33 y.o. female G1P0 at approximately 13 weeks of pregnancy who presents to the ED complaining of abdominal pain.  Patient reports that this evening she has developed burning when she urinates along with pain in her suprapubic area.  She denies any fevers and has not had any flank pain.  She does report feeling nauseous and vomited once earlier this evening.  She describes symptoms as similar to prior UTI, but wanted to get checked out to make sure her baby is not affected.  She denies any issues with this pregnancy thus far, has been following with [facility].     Physical Exam   Triage Vital Signs: ED Triage Vitals [08/06/23 2029]  Encounter Vitals Group     BP 114/75     Systolic BP Percentile      Diastolic BP Percentile      Pulse Rate 86     Resp 16     Temp 98 F (36.7 C)     Temp Source Oral     SpO2 100 %     Weight      Height      Head Circumference      Peak Flow      Pain Score      Pain Loc      Pain Education      Exclude from Growth Chart     Most recent vital signs: Vitals:   08/06/23 2029  BP: 114/75  Pulse: 86  Resp: 16  Temp: 98 F (36.7 C)  SpO2: 100%    Constitutional: Alert and oriented. Eyes: Conjunctivae are normal. Head: Atraumatic. Nose: No congestion/rhinnorhea. Mouth/Throat: Mucous membranes are moist.  Cardiovascular: Normal rate, regular rhythm. Grossly normal heart sounds.  2+ radial pulses bilaterally. Respiratory: Normal respiratory effort.  No retractions. Lungs CTAB. Gastrointestinal: Soft and nontender. No distention.  No CVA tenderness bilaterally. Musculoskeletal: No lower extremity tenderness nor edema.  Neurologic:  Normal speech and language. No gross focal neurologic deficits are appreciated.    ED  Results / Procedures / Treatments   Labs (all labs ordered are listed, but only abnormal results are displayed) Labs Reviewed  URINALYSIS, ROUTINE W REFLEX MICROSCOPIC - Abnormal; Notable for the following components:      Result Value   Color, Urine YELLOW (*)    APPearance CLOUDY (*)    Ketones, ur 20 (*)    Protein, ur 30 (*)    Leukocytes,Ua MODERATE (*)    Bacteria, UA RARE (*)    All other components within normal limits  URINE CULTURE    PROCEDURES:  Critical Care performed: No  Ultrasound ED OB Pelvic  Date/Time: 08/06/2023 11:50 PM  Performed by: Chesley Noon, MD Authorized by: Chesley Noon, MD   Procedure details:    Indications: pregnant with abdominal pain     Assess:  Intrauterine pregnancy   Technique:  Transabdominal obstetric (HCG+) exam   Images: not archived    Uterine findings:    Intrauterine pregnancy: identified     Single gestation: identified     Fetal heart rate: identified (171)         MEDICATIONS ORDERED IN ED: Medications  ondansetron (ZOFRAN-ODT) disintegrating tablet 4 mg (4 mg Oral Given 08/06/23 2348)  cephALEXin (KEFLEX) capsule 500 mg (500 mg Oral Given 08/06/23 2348)  acetaminophen (TYLENOL) tablet 1,000 mg (1,000 mg Oral Given 08/06/23 2348)     IMPRESSION / MDM / ASSESSMENT AND PLAN / ED COURSE  I reviewed the triage vital signs and the nursing notes.                              33 y.o. female G1P0 at approximately 13 weeks of pregnancy who presents to the ED complaining of suprapubic abdominal pain with dysuria starting earlier this evening.  Patient's presentation is most consistent with acute complicated illness / injury requiring diagnostic workup.  Differential diagnosis includes, but is not limited to, UTI, kidney stone, pyelonephritis, ectopic pregnancy.  Patient nontoxic-appearing and in no acute distress, vital signs are unremarkable.  Her abdomen is soft with no focal tenderness, urinalysis does appear  concerning for infection and symptoms likely due to UTI.  No findings concerning for kidney stone and she has no CVA tenderness to suggest pyelonephritis.  Bedside ultrasound shows intrauterine pregnancy with appropriate fetal heart rate.  Patient appropriate for outpatient management with OB/GYN follow-up, was given initial dose of antibiotics with Zofran here in the ED but is tolerating oral intake without difficulty.  She was counseled to return to the ED for new or worsening symptoms.  Patient agrees with plan.      FINAL CLINICAL IMPRESSION(S) / ED DIAGNOSES   Final diagnoses:  Acute cystitis without hematuria     Rx / DC Orders   ED Discharge Orders          Ordered    cephALEXin (KEFLEX) 500 MG capsule  4 times daily        08/06/23 2346    ondansetron (ZOFRAN-ODT) 4 MG disintegrating tablet  Every 8 hours PRN        08/06/23 2346             Note:  This document was prepared using Dragon voice recognition software and may include unintentional dictation errors.   Chesley Noon, MD 08/06/23 [phone]

## 2023-08-09 LAB — URINE CULTURE: Culture: 90000 — AB
